# Patient Record
Sex: Female | Born: 2000
Health system: Southern US, Community
[De-identification: ages and names within clinical notes are randomized; demographics above are authoritative.]

## PROBLEM LIST (undated history)

## (undated) DIAGNOSIS — F419 Anxiety disorder, unspecified: Secondary | ICD-10-CM

## (undated) DIAGNOSIS — F32A Depression, unspecified: Secondary | ICD-10-CM

## (undated) DIAGNOSIS — J302 Other seasonal allergic rhinitis: Secondary | ICD-10-CM

---
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
## 2019-09-13 ENCOUNTER — Other Ambulatory Visit: Payer: Self-pay

## 2019-09-13 ENCOUNTER — Ambulatory Visit
Admission: EM | Admit: 2019-09-13 | Discharge: 2019-09-13 | Disposition: A | Discharge status: Home / Self Care | Payer: Medicaid Other | Attending: Family Medicine | Admitting: Family Medicine

## 2019-09-13 DIAGNOSIS — Z20822 Contact with and (suspected) exposure to covid-19: Secondary | ICD-10-CM | POA: Insufficient documentation

## 2019-09-13 LAB — SARS CORONAVIRUS 2 (TAT 6-24 HRS): SARS Coronavirus 2: NEGATIVE

## 2019-09-13 NOTE — Discharge Instructions (Signed)
COVID-19: What Your Test Results Mean If you test positive for COVID-19 Take steps to help prevent the spread of COVID-19 Stay home.  Do not leave your home, except to get medical care. Do not visit public areas. Get rest and stay hydrated. Take over-the-counter medicines, such as acetaminophen, to help you feel better. Stay in touch with your doctor. Separate yourself from other people.  As much as possible, stay in a specific room and away from other people and pets in your home. If you test negative for COVID-19  You probably were not infected at the time your sample was collected.  However, that does not mean you will not get sick.  It is possible that you were very early in your infection when your sample was collected and that you could test positive later. A negative test result does not mean you won't get sick later. [email or web address] 06/02/2018 This information is not intended to replace advice given to you by your health care provider. Make sure you discuss any questions you have with your health care provider. Document Revised: 12/06/2018 Document Reviewed: 12/06/2018 Elsevier Patient Education  [employer]. Please continue to keep social distance of 6 feet from others, wash hands frequently and wear facemask indoors or outdoors if you are unable to social distance.  If your Covid test is positive, member of the urgent care team will reach out to you with further instructions. If you have any further questions, please don't hesitate to reach out to the urgent care clinic.  Please sign up for MyChart to access your lab results.

## 2019-09-13 NOTE — ED Triage Notes (Signed)
Pt here for COVID testing without symptoms

## 2019-12-11 ENCOUNTER — Ambulatory Visit
Admission: EM | Admit: 2019-12-11 | Discharge: 2019-12-11 | Disposition: A | Discharge status: Home / Self Care | Payer: Medicaid Other | Attending: Family Medicine | Admitting: Family Medicine

## 2019-12-11 ENCOUNTER — Other Ambulatory Visit: Payer: Self-pay

## 2019-12-11 DIAGNOSIS — R519 Headache, unspecified: Secondary | ICD-10-CM

## 2019-12-11 LAB — PREGNANCY, URINE: Preg Test, Ur: NEGATIVE

## 2019-12-11 MED ORDER — BUTALBITAL-APAP-CAFFEINE 50-325-40 MG PO TABS: 1.0000 | ORAL_TABLET | Freq: Four times a day (QID) | ORAL | 0 refills | Status: AC | PRN

## 2019-12-11 NOTE — Discharge Instructions (Signed)
Medication as prescribed.  Take care  Dr. Tavien Chestnut  

## 2019-12-11 NOTE — ED Triage Notes (Signed)
Patient states that she has been having a headache since yesterday with headache, pressure in head, nausea and report sthat she fainted yesterday. Reports that this all started yesterday and she has been taking tylenol without relief. Reports that this does not feel like her normal headache.

## 2019-12-11 NOTE — ED Provider Notes (Signed)
[facility]    CSN: [id number] Arrival date & time: 12/11/19  1441      History   Chief Complaint Chief Complaint  Patient presents with  . Headache   HPI  19 year old female presents with headache.  Headache started yesterday. Described as pressure. Located in the frontal region as well as the maxillary region. She reports associated nausea. Patient reports that she has been dizzy. Had an episode of syncope yesterday. She has taken Tylenol without relief. Denies photophobia. Rates her pain as 10/10 in severity. No known exacerbating factors.  Past Medical History:  Diagnosis Date  . Seasonal allergies     Home Medications    Prior to Admission medications   Medication Sig Start Date End Date Taking? Authorizing Provider  butalbital-acetaminophen-caffeine (FIORICET) 50-325-40 MG tablet Take 1 tablet by mouth every 6 (six) hours as needed for headache or migraine. 12/11/19 12/10/20  Tommie Sams, DO  montelukast (SINGULAIR) 10 MG tablet Take by mouth. 03/29/17 03/29/18  [provider]  medroxyPROGESTERone (DEPO-PROVERA) 150 MG/ML injection Inject 150 mg into the muscle every 3 (three) months.  12/11/19  [provider]   Social History Social History   Tobacco Use  . Smoking status: Current Every Day Smoker    Packs/day: 0.25    Types: Cigarettes  . Smokeless tobacco: Never Used  Vaping Use  . Vaping Use: Never used  Substance Use Topics  . Alcohol use: Not Currently  . Drug use: Never     Allergies   Patient has no known allergies.   Review of Systems Review of Systems  Gastrointestinal: Positive for nausea.  Neurological: Positive for headaches.   Physical Exam Triage Vital Signs ED Triage Vitals  Enc Vitals Group     BP 12/11/19 1511 117/68     Pulse Rate 12/11/19 1511 88     Resp 12/11/19 1511 16     Temp 12/11/19 1511 98.3 F (36.8 C)     Temp Source 12/11/19 1511 Oral     SpO2 12/11/19 1511 100 %     Weight 12/11/19  1509 125 lb (56.7 kg)     Height 12/11/19 1509 5' (1.524 m)     Head Circumference --      Peak Flow --      Pain Score 12/11/19 1509 10     Pain Loc --      Pain Edu? --      Excl. in GC? --    Updated Vital Signs BP 117/68 (BP Location: Right Arm)   Pulse 88   Temp 98.3 F (36.8 C) (Oral)   Resp 16   Ht 5' (1.524 m)   Wt 56.7 kg   LMP 11/28/2019   SpO2 100%   BMI 24.41 kg/m   Visual Acuity Right Eye Distance:   Left Eye Distance:   Bilateral Distance:    Right Eye Near:   Left Eye Near:    Bilateral Near:     Physical Exam Vitals and nursing note reviewed.  Constitutional:      General: She is not in acute distress.    Appearance: Normal appearance. She is not ill-appearing.  HENT:     Head: Normocephalic and atraumatic.  Eyes:     General:        Right eye: No discharge.        Left eye: No discharge.     Conjunctiva/sclera: Conjunctivae normal.  Cardiovascular:     Rate and Rhythm:  Normal rate and regular rhythm.  Pulmonary:     Effort: Pulmonary effort is normal.     Breath sounds: Normal breath sounds. No wheezing, rhonchi or rales.  Neurological:     Mental Status: She is alert.  Psychiatric:        Mood and Affect: Mood normal.        Behavior: Behavior normal.    UC Treatments / Results  Labs (all labs ordered are listed, but only abnormal results are displayed) Labs Reviewed  PREGNANCY, URINE    EKG   Radiology No results found.  Procedures Procedures (including critical care time)  Medications Ordered in UC Medications - No data to display  Initial Impression / Assessment and Plan / UC Course  I have reviewed the triage vital signs and the nursing notes.  Pertinent labs & imaging results that were available during my care of the patient were reviewed by me and considered in my medical decision making (see chart for details).    19 year old female presents with acute headache. Has a history of reported migraine. Concern for  atypical migraine. Discussed Covid testing and patient declined today. Treating with Fioricet. Pregnancy test was negative today.  Final Clinical Impressions(s) / UC Diagnoses   Final diagnoses:  Acute nonintractable headache, unspecified headache type     Discharge Instructions     Medication as prescribed.  Take care  Dr. Adriana Simas    ED Prescriptions    Medication Sig Dispense Auth. Provider   butalbital-acetaminophen-caffeine (FIORICET) 50-325-40 MG tablet Take 1 tablet by mouth every 6 (six) hours as needed for headache or migraine. 20 tablet Tommie Sams, DO     PDMP not reviewed this encounter.   Tommie Sams, [place] 12/11/19 1642

## 2020-02-18 ENCOUNTER — Other Ambulatory Visit: Payer: Self-pay

## 2020-02-18 ENCOUNTER — Ambulatory Visit
Admission: EM | Admit: 2020-02-18 | Discharge: 2020-02-18 | Disposition: A | Discharge status: Home / Self Care | Payer: Medicaid Other

## 2020-02-18 ENCOUNTER — Encounter: Payer: Self-pay | Admitting: Emergency Medicine

## 2020-02-18 DIAGNOSIS — G4700 Insomnia, unspecified: Secondary | ICD-10-CM

## 2020-02-18 DIAGNOSIS — R21 Rash and other nonspecific skin eruption: Secondary | ICD-10-CM | POA: Diagnosis not present

## 2020-02-18 MED ORDER — TRAZODONE HCL 50 MG PO TABS
50.0000 mg | ORAL_TABLET | Freq: Every day | ORAL | 0 refills | Status: AC
Start: 2020-02-18 — End: 2020-03-04

## 2020-02-18 MED ORDER — PREDNISONE 10 MG PO TABS: ORAL_TABLET | ORAL | 0 refills | Status: DC

## 2020-02-18 NOTE — ED Provider Notes (Signed)
[facility]    CSN: [id number] Arrival date & time: 02/18/20  1221      History   Chief Complaint Chief Complaint  Patient presents with  . Rash    HPI Jennifer Bray is a 20 y.o. female presenting for diffuse red, itchy rash diffusely on her body last night.  Patient states that she took Benadryl last night and it went away and then she took Benadryl again this morning and it went away.  She complains of a minor rash on her hands and arms currently.  Denies any facial rash.  Patient denies any throat tightness or swelling, chest tightness or breathing difficulty.  Patient denies any known allergens.  She denies starting any new medications.  Denies any new perfumes, lotions or detergents.  Not eating any new foods.  Never had any similar symptoms in the past.  Patient does request a refill of trazodone which she takes nightly for insomnia.  Patient also takes Prozac during the day for anxiety and depression.  No other complaints or concerns.  HPI  Past Medical History:  Diagnosis Date  . Seasonal allergies     There are no problems to display for this patient.   History reviewed. No pertinent surgical history.  OB History   No obstetric history on file.      Home Medications    Prior to Admission medications   Medication Sig Start Date End Date Taking? Authorizing Provider  predniSONE (DELTASONE) 10 MG tablet Take 6 tablets by mouth on the 1st day and decrease by 1 tablet daily 02/18/20  Yes Shirlee Latch, PA-C  traZODone (DESYREL) 50 MG tablet Take 1 tablet (50 mg total) by mouth at bedtime for 15 days. 02/18/20 03/04/20 Yes Shirlee Latch, PA-C  butalbital-acetaminophen-caffeine (FIORICET) [phone] MG tablet Take 1 tablet by mouth every 6 (six) hours as needed for headache or migraine. 12/11/19 12/10/20  Tommie Sams, DO  FLUoxetine (PROZAC) 10 MG capsule Take by mouth. 01/30/20   [provider]  montelukast (SINGULAIR) 10 MG tablet Take by mouth.  03/29/17 03/29/18  [provider]  medroxyPROGESTERone (DEPO-PROVERA) 150 MG/ML injection Inject 150 mg into the muscle every 3 (three) months.  12/11/19  [provider]    Family History No family history on file.  Social History Social History   Tobacco Use  . Smoking status: Current Every Day Smoker    Packs/day: 0.25    Types: Cigarettes  . Smokeless tobacco: Never Used  Vaping Use  . Vaping Use: Never used  Substance Use Topics  . Alcohol use: Not Currently  . Drug use: Yes    Types: Marijuana     Allergies   Patient has no known allergies.   Review of Systems Review of Systems  Constitutional: Negative for fatigue and fever.  HENT: Negative for congestion, sore throat and trouble swallowing.   Respiratory: Negative for cough, shortness of breath and wheezing.   Cardiovascular: Negative for chest pain.  Gastrointestinal: Negative for nausea and vomiting.  Musculoskeletal: Negative for arthralgias and myalgias.  Skin: Positive for rash. Negative for color change.  Allergic/Immunologic: Negative for environmental allergies and food allergies.  Neurological: Negative for dizziness, weakness and headaches.  Psychiatric/Behavioral: Positive for sleep disturbance.     Physical Exam Triage Vital Signs ED Triage Vitals  Enc Vitals Group     BP 02/18/20 1232 114/77     Pulse Rate 02/18/20 1232 70     Resp 02/18/20 1232 18  Temp 02/18/20 1232 98.5 F (36.9 C)     Temp Source 02/18/20 1232 Oral     SpO2 02/18/20 1232 100 %     Weight 02/18/20 1230 125 lb (56.7 kg)     Height 02/18/20 1230 5' (1.524 m)     Head Circumference --      Peak Flow --      Pain Score 02/18/20 1230 0     Pain Loc --      Pain Edu? --      Excl. in GC? --    No data found.  Updated Vital Signs BP 114/77 (BP Location: Left Arm)   Pulse 70   Temp 98.5 F (36.9 C) (Oral)   Resp 18   Ht 5' (1.524 m)   Wt 125 lb (56.7 kg)   LMP 02/04/2020 (Approximate)   SpO2  100%   BMI 24.41 kg/m       Physical Exam Vitals and nursing note reviewed.  Constitutional:      General: She is not in acute distress.    Appearance: Normal appearance. She is not ill-appearing or toxic-appearing.  HENT:     Head: Normocephalic and atraumatic.     Nose: Nose normal.     Mouth/Throat:     Mouth: Mucous membranes are moist.     Pharynx: Oropharynx is clear.  Eyes:     General: No scleral icterus.       Right eye: No discharge.        Left eye: No discharge.     Conjunctiva/sclera: Conjunctivae normal.  Cardiovascular:     Rate and Rhythm: Normal rate and regular rhythm.     Heart sounds: Normal heart sounds.  Pulmonary:     Effort: Pulmonary effort is normal. No respiratory distress.     Breath sounds: Normal breath sounds.  Musculoskeletal:     Cervical back: Neck supple.  Skin:    General: Skin is dry.     Findings: Rash (erythematous maculopapular rash affecting right forearm, dorsal hands, and lower back) present.  Neurological:     General: No focal deficit present.     Mental Status: She is alert. Mental status is at baseline.     Motor: No weakness.     Gait: Gait normal.  Psychiatric:        Mood and Affect: Mood normal.        Behavior: Behavior normal.        Thought Content: Thought content normal.      UC Treatments / Results  Labs (all labs ordered are listed, but only abnormal results are displayed) Labs Reviewed - No data to display  EKG   Radiology No results found.  Procedures Procedures (including critical care time)  Medications Ordered in UC Medications - No data to display  Initial Impression / Assessment and Plan / UC Course  I have reviewed the triage vital signs and the nursing notes.  Pertinent labs & imaging results that were available during my care of the patient were reviewed by me and considered in my medical decision making (see chart for details).   1.  Rash: Uncertain of etiology of rash since  patient cannot identify any possible triggers.  Advised her to continue Benadryl and I will also send in prednisone.  Encouraged her to make a follow-up appointment with her PCP if the rash continues over the next week.  Advised that in some cases patient is need to be referred to an  allergist to determine the cause of the rash.  ED precautions for allergies and rashes discussed with patient.  2.  Insomnia: Patient requested a refill of trazodone.  I reviewed patient's chart from her PCP office visit on 01/30/2020.  She takes 50 mg at bedtime as prescribed by her PCP.  Patient states that she ran out of her medication and would like a refill until she can see her PCP in 2 weeks.  I refilled the medication for her.   Final Clinical Impressions(s) / UC Diagnoses   Final diagnoses:  Rash  Insomnia, unspecified type     Discharge Instructions     DifficultContinue Benadryl as needed for the rash.  Have also sent prednisone.  If you continue to have rash throughout the week despite taking these medications, follow-up with PCP and request referral to an allergist to see what could be causing the rash.  Go to ED for any trouble swallowing.  I refilled your trazodone for the next 2 weeks.  Follow-up with PCP for additional refills.    ED Prescriptions    Medication Sig Dispense Auth. Provider   predniSONE (DELTASONE) 10 MG tablet Take 6 tablets by mouth on the 1st day and decrease by 1 tablet daily 21 tablet Eusebio Friendly B, PA-C   traZODone (DESYREL) 50 MG tablet Take 1 tablet (50 mg total) by mouth at bedtime for 15 days. 15 tablet Gareth Morgan     PDMP not reviewed this encounter.   Shirlee Latch, PA-C 02/18/20 1332

## 2020-02-18 NOTE — Discharge Instructions (Signed)
DifficultContinue Benadryl as needed for the rash.  Have also sent prednisone.  If you continue to have rash throughout the week despite taking these medications, follow-up with PCP and request referral to an allergist to see what could be causing the rash.  Go to ED for any trouble swallowing.  I refilled your trazodone for the next 2 weeks.  Follow-up with PCP for additional refills.

## 2020-02-18 NOTE — ED Triage Notes (Signed)
Pt c/o rash all over her body. She states when she take benadryl it goes away. She does not have the rash now.

## 2020-07-18 ENCOUNTER — Other Ambulatory Visit: Payer: Self-pay

## 2020-07-18 ENCOUNTER — Ambulatory Visit
Admission: EM | Admit: 2020-07-18 | Discharge: 2020-07-18 | Disposition: A | Discharge status: Home / Self Care | Payer: Medicaid Other | Attending: Family Medicine | Admitting: Family Medicine

## 2020-07-18 ENCOUNTER — Encounter: Payer: Self-pay | Admitting: Emergency Medicine

## 2020-07-18 DIAGNOSIS — K047 Periapical abscess without sinus: Secondary | ICD-10-CM | POA: Diagnosis present

## 2020-07-18 DIAGNOSIS — R519 Headache, unspecified: Secondary | ICD-10-CM | POA: Diagnosis not present

## 2020-07-18 LAB — POCT PREGNANCY, URINE: Preg Test, Ur: NEGATIVE

## 2020-07-18 MED ORDER — AMOXICILLIN-POT CLAVULANATE 875-125 MG PO TABS: 1.0000 | ORAL_TABLET | Freq: Two times a day (BID) | ORAL | 0 refills | Status: AC

## 2020-07-18 MED ORDER — IBUPROFEN 600 MG PO TABS: 600.0000 mg | ORAL_TABLET | Freq: Four times a day (QID) | ORAL | 0 refills | Status: AC | PRN

## 2020-07-18 NOTE — ED Provider Notes (Signed)
[facility]    CSN: [id number] Arrival date & time: 07/18/20  1119      History   Chief Complaint Chief Complaint  Patient presents with   Dental Pain   Headache    HPI Jennifer Bray is a 20 y.o. female.   HPI  20 year old female here for evaluation of headache and tooth pain.  Patient reports that she developed headache in both of her temples and in her frontal area 2 days ago.  This is associated with some nausea and light sensitivity.  She denies any aura.  She states she only started to get these headaches 1 year ago which her primary care doctor are identifying as migraines.  She was instructed to take Tylenol which has not helped.  Her dental pain started the same time and it is her left lower rear molar.  She states that it hurts to apply pressure but it does not hurt with temperature differences.  There is no drainage from around the tooth, fever, or swelling of her jaw.  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  Past Medical History:  Diagnosis Date   Seasonal allergies     There are no problems to display for this patient.   History reviewed. No pertinent surgical history.  OB History   No obstetric history on file.      Home Medications    Prior to Admission medications   Medication Sig Start Date End Date Taking? Authorizing Provider  amoxicillin-clavulanate (AUGMENTIN) 875-125 MG tablet Take 1 tablet by mouth every 12 (twelve) hours for 10 days. 07/18/20 07/28/20 Yes Becky Augusta, NP  ibuprofen (ADVIL) 600 MG tablet Take 1 tablet (600 mg total) by mouth every 6 (six) hours as needed. 07/18/20  Yes Becky Augusta, NP  butalbital-acetaminophen-caffeine (FIORICET) [phone] MG tablet Take 1 tablet by mouth every 6 (six) hours as needed for headache or migraine. 12/11/19 12/10/20  Tommie Sams, DO  FLUoxetine (PROZAC) 10 MG capsule Take by mouth. 01/30/20   [provider]  montelukast (SINGULAIR) 10 MG tablet Take by mouth. 03/29/17 03/29/18  [provider]  traZODone (DESYREL) 50 MG tablet Take 1 tablet (50 mg total) by mouth at bedtime for 15 days. 02/18/20 03/04/20  Shirlee Latch, PA-C  medroxyPROGESTERone (DEPO-PROVERA) 150 MG/ML injection Inject 150 mg into the muscle every 3 (three) months.  12/11/19  [provider]    Family History History reviewed. No pertinent family history.  Social History Social History   Tobacco Use   Smoking status: Every Day    Packs/day: 0.25    Types: Cigarettes   Smokeless tobacco: Never  Vaping Use   Vaping Use: Never used  Substance Use Topics   Alcohol use: Not Currently   Drug use: Yes    Types: Marijuana     Allergies   Patient has no known allergies.   Review of Systems Review of Systems  Constitutional:  Negative for activity change, appetite change and fever.  HENT:  Positive for dental problem. Negative for congestion, ear pain, facial swelling and rhinorrhea.   Eyes:  Positive for photophobia.  Gastrointestinal:  Positive for nausea.  Neurological:  Positive for headaches. Negative for dizziness.    Physical Exam Triage Vital Signs ED Triage Vitals  Enc Vitals Group     BP 07/18/20 1129 113/82     Pulse Rate 07/18/20 1129 77     Resp 07/18/20 1129 14     Temp 07/18/20 1129 98.7 F (37.1 C)  Temp Source 07/18/20 1129 Oral     SpO2 07/18/20 1129 100 %     Weight 07/18/20 1127 105 lb (47.6 kg)     Height 07/18/20 1127 5' (1.524 m)     Head Circumference --      Peak Flow --      Pain Score 07/18/20 1127 7     Pain Loc --      Pain Edu? --      Excl. in GC? --    No data found.  Updated Vital Signs BP 113/82 (BP Location: Left Arm)   Pulse 77   Temp 98.7 F (37.1 C) (Oral)   Resp 14   Ht 5' (1.524 m)   Wt 105 lb (47.6 kg)   LMP  (LMP Unknown)   SpO2 100%   BMI 20.51 kg/m   Visual Acuity Right Eye Distance:   Left Eye Distance:   Bilateral Distance:    Right Eye Near:   Left Eye Near:    Bilateral Near:     Physical  Exam Vitals and nursing note reviewed.  Constitutional:      General: She is not in acute distress.    Appearance: Normal appearance. She is normal weight. She is not ill-appearing.  HENT:     Head: Normocephalic and atraumatic.     Mouth/Throat:     Mouth: Mucous membranes are moist.     Pharynx: Oropharynx is clear. No oropharyngeal exudate or posterior oropharyngeal erythema.  Eyes:     General: No scleral icterus.    Extraocular Movements: Extraocular movements intact.     Pupils: Pupils are equal, round, and reactive to light.  Skin:    General: Skin is warm and dry.     Capillary Refill: Capillary refill takes less than 2 seconds.     Findings: No erythema or rash.  Neurological:     General: No focal deficit present.     Mental Status: She is alert and oriented to person, place, and time.  Psychiatric:        Mood and Affect: Mood normal.        Behavior: Behavior normal.        Thought Content: Thought content normal.        Judgment: Judgment normal.     UC Treatments / Results  Labs (all labs ordered are listed, but only abnormal results are displayed) Labs Reviewed  POC URINE PREG, ED  POCT PREGNANCY, URINE    EKG   Radiology No results found.  Procedures Procedures (including critical care time)  Medications Ordered in UC Medications - No data to display  Initial Impression / Assessment and Plan / UC Course  I have reviewed the triage vital signs and the nursing notes.  Pertinent labs & imaging results that were available during my care of the patient were reviewed by me and considered in my medical decision making (see chart for details).  Patient is a nontoxic-appearing 20 year old female here for evaluation of tooth ache and headache as outlined in the HPI above.  Patient reports that her headache is in both temples and in her forehead which is a typical presentation for her headaches.  She was still getting headaches a year ago.  She is taken  Tylenol without any relief.  She has not tried ibuprofen.  Patient's physical exam reveals pupils are equal round and reactive and her EOM is intact.  Patient is light sensitive.  Cranial nerves II through XII are intact.  Oropharyngeal exam reveals pink and moist gingival tissue without any drainage from around any of her teeth.  Patient reports pain in the last molar on the left mandible in the rear.  This tooth is tender to percussion.  There is no drainage from around the tooth.  There is no bogginess to the floor of the mouth, the submental area, or on the buccal side of the gum.  The gum is tender to palpation.  Suspect patient has an apical abscess and will treat with Augmentin twice daily for 10 days.  I have also advised patient to try ibuprofen for her headache and will prescribe 600 mg tablets that she can take every 6 hours.  Patient vies to return if her pain does not improve and we could try sumatriptan.   Final Clinical Impressions(s) / UC Diagnoses   Final diagnoses:  Dental abscess  Acute nonintractable headache, unspecified headache type     Discharge Instructions      Take the ibuprofen 600 mg every 6 hours with food for relief of your headache pain and dental pain.  Use it on as-needed basis only.  Take the Augmentin twice daily with food for 10 days for treatment of your dental infection.  Make an appointment to follow-up with your dentist as you may need x-rays or further dental procedures done to resolve the infection causing your dental pain.  If you do not have any relief of your symptoms, or they get worse I would recommend going to [facility] as they have a dentist on-call.  If your headache pain does not improve with the ibuprofen you can return for reevaluation and we can try Imitrex.     ED Prescriptions     Medication Sig Dispense Auth. Provider   ibuprofen (ADVIL) 600 MG tablet Take 1 tablet (600 mg total) by mouth every 6 (six) hours as needed. 30  tablet Becky Augusta, NP   amoxicillin-clavulanate (AUGMENTIN) 875-125 MG tablet Take 1 tablet by mouth every 12 (twelve) hours for 10 days. 20 tablet Becky Augusta, NP      PDMP not reviewed this encounter.   Becky Augusta, NP 07/18/20 1207

## 2020-07-18 NOTE — Discharge Instructions (Addendum)
Take the ibuprofen 600 mg every 6 hours with food for relief of your headache pain and dental pain.  Use it on as-needed basis only.  Take the Augmentin twice daily with food for 10 days for treatment of your dental infection.  Make an appointment to follow-up with your dentist as you may need x-rays or further dental procedures done to resolve the infection causing your dental pain.  If you do not have any relief of your symptoms, or they get worse I would recommend going to [facility] as they have a dentist on-call.  If your headache pain does not improve with the ibuprofen you can return for reevaluation and we can try Imitrex.

## 2020-07-18 NOTE — ED Triage Notes (Signed)
Patient tooth pain on the left lower side that started on Thursday.  Patient c/o HA that started yesterday.  Patient denies fevers.

## 2020-08-06 ENCOUNTER — Encounter: Payer: Self-pay | Admitting: Emergency Medicine

## 2020-08-06 ENCOUNTER — Other Ambulatory Visit: Payer: Self-pay

## 2020-08-06 ENCOUNTER — Ambulatory Visit
Admission: EM | Admit: 2020-08-06 | Discharge: 2020-08-06 | Disposition: A | Discharge status: Home / Self Care | Payer: Medicaid Other | Attending: Emergency Medicine | Admitting: Emergency Medicine

## 2020-08-06 DIAGNOSIS — B349 Viral infection, unspecified: Secondary | ICD-10-CM | POA: Insufficient documentation

## 2020-08-06 DIAGNOSIS — Z20822 Contact with and (suspected) exposure to covid-19: Secondary | ICD-10-CM | POA: Insufficient documentation

## 2020-08-06 LAB — RESP PANEL BY RT-PCR (FLU A&B, COVID) ARPGX2
Influenza A by PCR: NEGATIVE
Influenza B by PCR: NEGATIVE
SARS Coronavirus 2 by RT PCR: NEGATIVE

## 2020-08-06 NOTE — ED Provider Notes (Signed)
[facility]    CSN: [id number] Arrival date & time: 08/06/20  1047      History   Chief Complaint Chief Complaint  Patient presents with   Sore Throat   Nausea   Dizziness    HPI Jennifer Bray is a 20 y.o. female.   Patient is a 20 year old female who presents with chief complaint of sore throat, burning sensation in her head, lightheadedness, and nausea.  Patient states her symptoms started at work yesterday.  She works as a Mudlogger at [employer] and states she has had some sick coworkers but they are not told where they have COVID or not.  She has had no contacts that have been sick at home.  Patient reports that she had COVID back in the pandemic for started and has not had any COVID vaccinations.  She is taken Tylenol at home but has not helped.  Patient denies fever or chills but does report some shortness of breath.  Patient denies any body aches or pains or any loss of taste or smell.  Patient also denies any ear pain or fullness.   Past Medical History:  Diagnosis Date   Seasonal allergies     There are no problems to display for this patient.   History reviewed. No pertinent surgical history.  OB History   No obstetric history on file.      Home Medications    Prior to Admission medications   Medication Sig Start Date End Date Taking? Authorizing Provider  butalbital-acetaminophen-caffeine (FIORICET) 50-325-40 MG tablet Take 1 tablet by mouth every 6 (six) hours as needed for headache or migraine. 12/11/19 12/10/20  Tommie Sams, DO  FLUoxetine (PROZAC) 10 MG capsule Take by mouth. 01/30/20   [provider]  ibuprofen (ADVIL) 600 MG tablet Take 1 tablet (600 mg total) by mouth every 6 (six) hours as needed. 07/18/20   Becky Augusta, NP  montelukast (SINGULAIR) 10 MG tablet Take by mouth. 03/29/17 03/29/18  [provider]  traZODone (DESYREL) 50 MG tablet Take 1 tablet (50 mg total) by mouth at bedtime for 15 days. 02/18/20 03/04/20   Shirlee Latch, PA-C  medroxyPROGESTERone (DEPO-PROVERA) 150 MG/ML injection Inject 150 mg into the muscle every 3 (three) months.  12/11/19  [provider]    Family History History reviewed. No pertinent family history.  Social History Social History   Tobacco Use   Smoking status: Every Day    Packs/day: 0.25    Types: Cigarettes   Smokeless tobacco: Never  Vaping Use   Vaping Use: Never used  Substance Use Topics   Alcohol use: Not Currently   Drug use: Yes    Types: Marijuana     Allergies   Patient has no known allergies.   Review of Systems Review of Systems as noted above in HPI.  Other systems reviewed and found to be negative   Physical Exam Triage Vital Signs ED Triage Vitals  Enc Vitals Group     BP 08/06/20 1131 113/83     Pulse Rate 08/06/20 1131 79     Resp 08/06/20 1131 18     Temp 08/06/20 1131 98.1 F (36.7 C)     Temp Source 08/06/20 1131 Oral     SpO2 08/06/20 1131 100 %     Weight --      Height --      Head Circumference --      Peak Flow --      Pain Score  08/06/20 1127 8     Pain Loc --      Pain Edu? --      Excl. in GC? --    No data found.  Updated Vital Signs BP 113/83   Pulse 79   Temp 98.1 F (36.7 C) (Oral)   Resp 18   LMP  (LMP Unknown)   SpO2 100%     Physical Exam Constitutional:      General: She is not in acute distress.    Appearance: She is well-developed.  HENT:     Right Ear: Tympanic membrane normal.     Left Ear: Tympanic membrane normal.     Mouth/Throat:     Mouth: Mucous membranes are moist.     Pharynx: Posterior oropharyngeal erythema (minimal) present.     Tonsils: No tonsillar exudate or tonsillar abscesses. 0 on the right. 0 on the left.  Eyes:     Conjunctiva/sclera: Conjunctivae normal.     Pupils: Pupils are equal, round, and reactive to light.  Cardiovascular:     Rate and Rhythm: Normal rate and regular rhythm.     Heart sounds: Normal heart sounds.  Pulmonary:      Effort: Pulmonary effort is normal. No respiratory distress.     Breath sounds: Normal breath sounds. No stridor.  Abdominal:     Palpations: Abdomen is soft.  Musculoskeletal:     Cervical back: Normal range of motion.  Lymphadenopathy:     Cervical: No cervical adenopathy.  Skin:    General: Skin is warm and dry.     Capillary Refill: Capillary refill takes less than 2 seconds.  Neurological:     General: No focal deficit present.     Mental Status: She is alert and oriented to person, place, and time.     UC Treatments / Results  Labs (all labs ordered are listed, but only abnormal results are displayed) Labs Reviewed  RESP PANEL BY RT-PCR (FLU A&B, COVID) [id number]    EKG   Radiology No results found.  Procedures Procedures (including critical care time)  Medications Ordered in UC Medications - No data to display  Initial Impression / Assessment and Plan / UC Course  I have reviewed the triage vital signs and the nursing notes.  Pertinent labs & imaging results that were available during my care of the patient were reviewed by me and considered in my medical decision making (see chart for details).    Patient presents with upper restorer symptoms of sore throat, lightheadedness, nausea, and a burning sensation in her head as well as some shortness of breath.  Patient without fever, chills, or body aches.  She does report sick contacts at work but they are not notified if there are coworkers are positive for COVID.  Send a COVID swab.  COVID swab was negative.  Likely viral illness.  Treat with symptom management.  Have her return should symptoms worsen or not improve Final Clinical Impressions(s) / UC Diagnoses   Final diagnoses:  Viral illness     Discharge Instructions      -Flu and COVID swabs are negative -Symptoms most likely a viral illness. -Recommend over-the-counter medications for symptom management.  Ibuprofen and Tylenol as needed for pain and  fever.  continue your Singulair. -Can use nasal saline rinse and Flonase to help with nasal congestion. -Push fluids -Rest as able until symptoms improve     ED Prescriptions   None    PDMP not reviewed this encounter.  Candis Schatz, PA-C 08/06/20 1232

## 2020-08-06 NOTE — Discharge Instructions (Addendum)
-  Flu and COVID swabs are negative -Symptoms most likely a viral illness. -Recommend over-the-counter medications for symptom management.  Ibuprofen and Tylenol as needed for pain and fever.  continue your Singulair. -Can use nasal saline rinse and Flonase to help with nasal congestion. -Push fluids -Rest as able until symptoms improve

## 2020-08-06 NOTE — ED Triage Notes (Signed)
Pt is present today with congestion, HA, lightheadedness, sore throat, and nausea. Pt states that her sx started yesterday

## 2020-10-01 ENCOUNTER — Ambulatory Visit
Admission: EM | Admit: 2020-10-01 | Discharge: 2020-10-01 | Disposition: A | Discharge status: Home / Self Care | Payer: Medicaid Other | Attending: Emergency Medicine | Admitting: Emergency Medicine

## 2020-10-01 ENCOUNTER — Other Ambulatory Visit: Payer: Self-pay

## 2020-10-01 DIAGNOSIS — K2990 Gastroduodenitis, unspecified, without bleeding: Secondary | ICD-10-CM

## 2020-10-01 DIAGNOSIS — K2970 Gastritis, unspecified, without bleeding: Secondary | ICD-10-CM | POA: Diagnosis not present

## 2020-10-01 MED ORDER — OMEPRAZOLE 20 MG PO CPDR: 20.0000 mg | DELAYED_RELEASE_CAPSULE | Freq: Every day | ORAL | 0 refills | Status: DC

## 2020-10-01 MED ORDER — ALUM & MAG HYDROXIDE-SIMETH 200-200-20 MG/5ML PO SUSP
30.0000 mL | Freq: Once | ORAL | Status: AC
  Administered 2020-10-01: 30 mL via ORAL

## 2020-10-01 MED ORDER — LIDOCAINE VISCOUS HCL 2 % MT SOLN
15.0000 mL | Freq: Once | OROMUCOSAL | Status: AC
  Administered 2020-10-01: 15 mL via ORAL

## 2020-10-01 MED ORDER — ONDANSETRON 8 MG PO TBDP
8.0000 mg | ORAL_TABLET | Freq: Once | ORAL | Status: AC
  Administered 2020-10-01: 8 mg via ORAL

## 2020-10-01 NOTE — ED Provider Notes (Addendum)
[facility]    CSN: [id number] Arrival date & time: 10/01/20  1336      History   Chief Complaint Chief Complaint  Patient presents with   Emesis    HPI Jennifer Bray is a 20 y.o. female.   HPI  Three 20 year old female here for evaluation of GI complaints.  Patient reports that she has had 2 episodes of vomiting in the last 3 days.  This is associated with some epigastric burning, nausea, and 1 to episodes of diarrhea.  She denies any additional abdominal pain, fever, blood in her emesis or stool, upper respiratory symptoms, or cough.  She states that she has been around some people who recently tested positive for the flu.  Patient reports that she eats spicy food every day, drinks 3-4 sodas every day, and also smokes daily.  Her last consumption of spicy foods was the day her symptoms started.  Past Medical History:  Diagnosis Date   Seasonal allergies     There are no problems to display for this patient.   History reviewed. No pertinent surgical history.  OB History   No obstetric history on file.      Home Medications    Prior to Admission medications   Medication Sig Start Date End Date Taking? Authorizing Provider  omeprazole (PRILOSEC) 20 MG capsule Take 1 capsule (20 mg total) by mouth daily. 10/01/20  Yes Becky Augusta, NP  butalbital-acetaminophen-caffeine (FIORICET) [phone] MG tablet Take 1 tablet by mouth every 6 (six) hours as needed for headache or migraine. 12/11/19 12/10/20  Tommie Sams, DO  FLUoxetine (PROZAC) 10 MG capsule Take by mouth. 01/30/20   [provider]  ibuprofen (ADVIL) 600 MG tablet Take 1 tablet (600 mg total) by mouth every 6 (six) hours as needed. 07/18/20   Becky Augusta, NP  montelukast (SINGULAIR) 10 MG tablet Take by mouth. 03/29/17 03/29/18  [provider]  traZODone (DESYREL) 50 MG tablet Take 1 tablet (50 mg total) by mouth at bedtime for 15 days. 02/18/20 03/04/20  Shirlee Latch, PA-C   medroxyPROGESTERone (DEPO-PROVERA) 150 MG/ML injection Inject 150 mg into the muscle every 3 (three) months.  12/11/19  [provider]    Family History History reviewed. No pertinent family history.  Social History Social History   Tobacco Use   Smoking status: Every Day    Packs/day: 0.25    Types: Cigarettes   Smokeless tobacco: Never  Vaping Use   Vaping Use: Never used  Substance Use Topics   Alcohol use: Not Currently   Drug use: Yes    Types: Marijuana     Allergies   Patient has no known allergies.   Review of Systems Review of Systems  Constitutional:  Negative for activity change, appetite change and fever.  HENT:  Negative for congestion, ear pain, rhinorrhea and sore throat.   Respiratory:  Negative for cough.   Gastrointestinal:  Positive for abdominal pain, diarrhea, nausea and vomiting.  Hematological: Negative.   Psychiatric/Behavioral: Negative.      Physical Exam Triage Vital Signs ED Triage Vitals  Enc Vitals Group     BP 10/01/20 1402 113/68     Pulse Rate 10/01/20 1402 70     Resp 10/01/20 1402 16     Temp 10/01/20 1402 98.5 F (36.9 C)     Temp Source 10/01/20 1402 Oral     SpO2 10/01/20 1402 100 %     Weight 10/01/20 1403 109 lb (49.4 kg)  Height 10/01/20 1403 5' (1.524 m)     Head Circumference --      Peak Flow --      Pain Score 10/01/20 1403 0     Pain Loc --      Pain Edu? --      Excl. in GC? --    No data found.  Updated Vital Signs BP 113/68   Pulse 70   Temp 98.5 F (36.9 C) (Oral)   Resp 16   Ht 5' (1.524 m)   Wt 109 lb (49.4 kg)   LMP 09/14/2020 (Exact Date)   SpO2 100%   BMI 21.29 kg/m   Visual Acuity Right Eye Distance:   Left Eye Distance:   Bilateral Distance:    Right Eye Near:   Left Eye Near:    Bilateral Near:     Physical Exam Vitals and nursing note reviewed.  Constitutional:      General: She is not in acute distress.    Appearance: Normal appearance. She is normal weight.  She is not ill-appearing.  HENT:     Head: Normocephalic and atraumatic.  Cardiovascular:     Rate and Rhythm: Normal rate and regular rhythm.     Pulses: Normal pulses.     Heart sounds: Normal heart sounds. No murmur heard.   No gallop.  Pulmonary:     Effort: Pulmonary effort is normal.     Breath sounds: Normal breath sounds. No wheezing, rhonchi or rales.  Abdominal:     General: Abdomen is flat. Bowel sounds are normal. There is no distension.     Palpations: Abdomen is soft.     Tenderness: There is abdominal tenderness. There is no guarding or rebound.  Skin:    General: Skin is warm and dry.     Capillary Refill: Capillary refill takes less than 2 seconds.     Findings: No erythema or rash.  Neurological:     General: No focal deficit present.     Mental Status: She is alert and oriented to person, place, and time.  Psychiatric:        Mood and Affect: Mood normal.        Behavior: Behavior normal.        Thought Content: Thought content normal.        Judgment: Judgment normal.     UC Treatments / Results  Labs (all labs ordered are listed, but only abnormal results are displayed) Labs Reviewed - No data to display  EKG   Radiology No results found.  Procedures Procedures (including critical care time)  Medications Ordered in UC Medications  ondansetron (ZOFRAN-ODT) disintegrating tablet 8 mg (8 mg Oral Given 10/01/20 1524)  alum & mag hydroxide-simeth (MAALOX/MYLANTA) 200-200-20 MG/5ML suspension 30 mL (30 mLs Oral Given 10/01/20 1524)    And  lidocaine (XYLOCAINE) 2 % viscous mouth solution 15 mL (15 mLs Oral Given 10/01/20 1524)    Initial Impression / Assessment and Plan / UC Course  I have reviewed the triage vital signs and the nursing notes.  Pertinent labs & imaging results that were available during my care of the patient were reviewed by me and considered in my medical decision making (see chart for details).  Patient is a very pleasant,  nontoxic-appearing 20 year old female here for evaluation of epigastric burning with vomiting and diarrhea that started 3 days ago.  She has had 1-2 episodes of diarrhea and 2 episodes of vomiting.  Not a lot of have blood  in it.  She is had some mild nausea.  She states that she has been eating and drinking but she has been eating bland fluids which still make her nauseous.  She states that she has a pretty significant diet of spicy foods, drinks 3+ caffeinated beverages a day, and also smokes daily.  Patient's physical exam reveals a benign cardiopulmonary exam with clear lung sounds in all fields.  Abdomen soft, flat, with positive bowel sounds all 4 quadrants.  Patient has moderate epigastric tenderness without guarding or rebound.  No additional tenderness discovered in the abdomen.  Suspect patient has gastritis secondary to diet and smoking.  Will give Zofran and GI cocktail and reassess.  Patient reports that she is feeling significantly better following the Zofran and GI cocktail.  Will discharge patient with a diagnosis of gastritis with a PPI and have her follow a bland diet until her symptoms resolve.     Final Clinical Impressions(s) / UC Diagnoses   Final diagnoses:  Gastritis and gastroduodenitis     Discharge Instructions      Avoid caffeine, spicy foods, and cut back on smoking as much as possible as this will make your gastritis worse.  Take the omeprazole once daily for the next 30 days to calm down stomach inflammation by cutting back on the amount of stomach acid being produced.  Follow a bland diet until your symptoms have completely resolved.  You can also take over-the-counter aloe vera, 3 to 4 ounces of juice, 1 or 2 gelcaps twice daily, to calm down gastric inflammation.  Return for reevaluation if develop any increased abdominal pain, increasing vomiting or diarrhea, blood in your vomit or diarrhea, or fever.     ED Prescriptions     Medication Sig Dispense  Auth. Provider   omeprazole (PRILOSEC) 20 MG capsule Take 1 capsule (20 mg total) by mouth daily. 30 capsule Becky Augusta, NP      PDMP not reviewed this encounter.   Becky Augusta, NP 10/01/20 1527    Becky Augusta, NP 10/01/20 [phone]

## 2020-10-01 NOTE — ED Triage Notes (Signed)
Pt reports having 2 episodes of emesis over the last 3 days. Denies having any other symptoms.

## 2020-10-01 NOTE — Discharge Instructions (Addendum)
Avoid caffeine, spicy foods, and cut back on smoking as much as possible as this will make your gastritis worse.  Take the omeprazole once daily for the next 30 days to calm down stomach inflammation by cutting back on the amount of stomach acid being produced.  Follow a bland diet until your symptoms have completely resolved.  You can also take over-the-counter aloe vera, 3 to 4 ounces of juice, 1 or 2 gelcaps twice daily, to calm down gastric inflammation.  Return for reevaluation if develop any increased abdominal pain, increasing vomiting or diarrhea, blood in your vomit or diarrhea, or fever.

## 2020-11-04 ENCOUNTER — Ambulatory Visit: Payer: Self-pay

## 2020-11-05 ENCOUNTER — Other Ambulatory Visit: Payer: Self-pay

## 2020-11-05 ENCOUNTER — Encounter: Payer: Self-pay | Admitting: Emergency Medicine

## 2020-11-05 ENCOUNTER — Inpatient Hospital Stay
Admission: RE | Admit: 2020-11-05 | Discharge: 2020-11-05 | Disposition: A | Discharge status: Home / Self Care | Payer: Self-pay | Source: Ambulatory Visit

## 2020-11-05 ENCOUNTER — Ambulatory Visit
Admission: EM | Admit: 2020-11-05 | Discharge: 2020-11-05 | Disposition: A | Discharge status: Home / Self Care | Payer: Medicaid Other | Attending: Physician Assistant | Admitting: Physician Assistant

## 2020-11-05 DIAGNOSIS — B9689 Other specified bacterial agents as the cause of diseases classified elsewhere: Secondary | ICD-10-CM | POA: Diagnosis present

## 2020-11-05 DIAGNOSIS — N760 Acute vaginitis: Secondary | ICD-10-CM | POA: Diagnosis present

## 2020-11-05 LAB — CHLAMYDIA/NGC RT PCR (ARMC ONLY)
Chlamydia Tr: NOT DETECTED
N gonorrhoeae: NOT DETECTED

## 2020-11-05 LAB — WET PREP, GENITAL
Sperm: NONE SEEN
Trich, Wet Prep: NONE SEEN
WBC, Wet Prep HPF POC: NONE SEEN
Yeast Wet Prep HPF POC: NONE SEEN

## 2020-11-05 MED ORDER — METRONIDAZOLE 500 MG PO TABS: 500.0000 mg | ORAL_TABLET | Freq: Two times a day (BID) | ORAL | 0 refills | Status: DC

## 2020-11-05 NOTE — ED Provider Notes (Addendum)
[facility]    CSN: [id number] Arrival date & time: 11/05/20  1609      History   Chief Complaint Chief Complaint  Patient presents with   Vaginal Discharge   vaginal discomfort    HPI Llana Deshazo is a 20 y.o. female.   Patient presents with Pete Schnitzer thin discharge, vaginal irritation and lower abdominal cramping for 3 days.  Denies itching, odor, dysuria, urinary frequency, urinary urgency, hematuria, flank pain.  Sexually active, 1 partner, no condom use.  No pertinent medical history.   Past Medical History:  Diagnosis Date   Seasonal allergies     There are no problems to display for this patient.   History reviewed. No pertinent surgical history.  OB History   No obstetric history on file.      Home Medications    Prior to Admission medications   Medication Sig Start Date End Date Taking? Authorizing Provider  butalbital-acetaminophen-caffeine (FIORICET) 50-325-40 MG tablet Take 1 tablet by mouth every 6 (six) hours as needed for headache or migraine. 12/11/19 12/10/20  Tommie Sams, DO  FLUoxetine (PROZAC) 10 MG capsule Take by mouth. 01/30/20   [provider]  ibuprofen (ADVIL) 600 MG tablet Take 1 tablet (600 mg total) by mouth every 6 (six) hours as needed. 07/18/20   Becky Augusta, NP  montelukast (SINGULAIR) 10 MG tablet Take by mouth. 03/29/17 03/29/18  [provider]  omeprazole (PRILOSEC) 20 MG capsule Take 1 capsule (20 mg total) by mouth daily. 10/01/20   Becky Augusta, NP  traZODone (DESYREL) 50 MG tablet Take 1 tablet (50 mg total) by mouth at bedtime for 15 days. 02/18/20 03/04/20  Shirlee Latch, PA-C  medroxyPROGESTERone (DEPO-PROVERA) 150 MG/ML injection Inject 150 mg into the muscle every 3 (three) months.  12/11/19  [provider]    Family History History reviewed. No pertinent family history.  Social History Social History   Tobacco Use   Smoking status: Every Day    Packs/day: 0.25    Types:  Cigarettes   Smokeless tobacco: Never  Vaping Use   Vaping Use: Never used  Substance Use Topics   Alcohol use: Not Currently   Drug use: Yes    Types: Marijuana     Allergies   Patient has no known allergies.   Review of Systems Review of Systems  Constitutional: Negative.   Respiratory: Negative.    Gastrointestinal:  Positive for abdominal pain. Negative for abdominal distention, anal bleeding, blood in stool, constipation, diarrhea, nausea, rectal pain and vomiting.  Genitourinary:  Positive for vaginal discharge and vaginal pain. Negative for decreased urine volume, difficulty urinating, dyspareunia, dysuria, enuresis, flank pain, frequency, genital sores, hematuria, menstrual problem, pelvic pain, urgency and vaginal bleeding.  Skin: Negative.     Physical Exam Triage Vital Signs ED Triage Vitals  Enc Vitals Group     BP 11/05/20 1713 111/86     Pulse Rate 11/05/20 1713 80     Resp 11/05/20 1713 16     Temp 11/05/20 1713 98.6 F (37 C)     Temp Source 11/05/20 1713 Oral     SpO2 11/05/20 1713 100 %     Weight --      Height --      Head Circumference --      Peak Flow --      Pain Score 11/05/20 1711 0     Pain Loc --      Pain Edu? --  Excl. in GC? --    No data found.  Updated Vital Signs BP 111/86 (BP Location: Right Arm)   Pulse 80   Temp 98.6 F (37 C) (Oral)   Resp 16   LMP 10/16/2020   SpO2 100%   Visual Acuity Right Eye Distance:   Left Eye Distance:   Bilateral Distance:    Right Eye Near:   Left Eye Near:    Bilateral Near:     Physical Exam Constitutional:      Appearance: Normal appearance. She is normal weight.  HENT:     Head: Normocephalic.  Eyes:     Extraocular Movements: Extraocular movements intact.  Pulmonary:     Effort: Pulmonary effort is normal.  Abdominal:     General: Abdomen is flat. Bowel sounds are normal.     Palpations: Abdomen is soft.     Tenderness: There is abdominal tenderness in the suprapubic  area. There is no right CVA tenderness or left CVA tenderness.  Genitourinary:    Comments: Deferred self collect vaginal swab Skin:    General: Skin is warm and dry.  Neurological:     Mental Status: She is alert and oriented to person, place, and time. Mental status is at baseline.  Psychiatric:        Mood and Affect: Mood normal.        Behavior: Behavior normal.     UC Treatments / Results  Labs (all labs ordered are listed, but only abnormal results are displayed) Labs Reviewed  WET PREP, GENITAL  CHLAMYDIA/NGC RT PCR [facility] ONLY)              EKG   Radiology No results found.  Procedures Procedures (including critical care time)  Medications Ordered in UC Medications - No data to display  Initial Impression / Assessment and Plan / UC Course  I have reviewed the triage vital signs and the nursing notes.  Pertinent labs & imaging results that were available during my care of the patient were reviewed by me and considered in my medical decision making (see chart for details).  Bacterial vaginosis  1.  Wet prep positive for clue cells, negative for yeast and trichomoniasis, discussed results with patient 2.  Metronidazole 500 mg twice daily for 7 days 3.  Gonorrhea and chlamydia pending, will treat per protocol 4.  Advised abstinence until final labs result, treatment is complete and all symptoms have resolved 5.  Follow-up with urgent care as needed Final Clinical Impressions(s) / UC Diagnoses   Final diagnoses:  None   Discharge Instructions   None    ED Prescriptions   None    PDMP not reviewed this encounter.   Hans Eden, NP 11/05/20 1741    Hans Eden, NP 11/11/20 1949

## 2020-11-05 NOTE — Discharge Instructions (Signed)
Today your wet prep showed clue cells meaning presence of bacteria vaginosis, it was negative for yeast and trichomoniasis  Take metronidazole twice a day for the next 7 days  Labs pending for gonorrhea and chlamydia 2-3 days, you will be contacted if positive for any sti and treatment will be sent to the pharmacy, you will have to return to the clinic if positive for gonorrhea to receive treatment   Please refrain from having sex until labs results, if positive please refrain from having sex until treatment complete and symptoms resolve   If positive for  Chlamydia  gonorrhea or please notify partner or partners so they may tested as well  Moving forward, it is recommended you use some form of protection against the transmission of sti infections  such as condoms or dental dams with each sexual encounter

## 2020-11-05 NOTE — ED Triage Notes (Signed)
Pt presents today with c/o vaginal discomfort with vaginal discharge (white) x 3 days. Denies odor or dysuria.

## 2021-02-14 ENCOUNTER — Emergency Department
Admission: EM | Admit: 2021-02-14 | Discharge: 2021-02-14 | Disposition: A | Discharge status: Home / Self Care | Payer: Medicaid Other | Attending: Emergency Medicine | Admitting: Emergency Medicine

## 2021-02-14 ENCOUNTER — Encounter: Payer: Self-pay | Admitting: Emergency Medicine

## 2021-02-14 ENCOUNTER — Other Ambulatory Visit: Payer: Self-pay

## 2021-02-14 ENCOUNTER — Emergency Department: Payer: Medicaid Other

## 2021-02-14 DIAGNOSIS — N9489 Other specified conditions associated with female genital organs and menstrual cycle: Secondary | ICD-10-CM | POA: Diagnosis not present

## 2021-02-14 DIAGNOSIS — E871 Hypo-osmolality and hyponatremia: Secondary | ICD-10-CM | POA: Insufficient documentation

## 2021-02-14 DIAGNOSIS — O4690 Antepartum hemorrhage, unspecified, unspecified trimester: Secondary | ICD-10-CM

## 2021-02-14 DIAGNOSIS — O26851 Spotting complicating pregnancy, first trimester: Secondary | ICD-10-CM | POA: Diagnosis present

## 2021-02-14 DIAGNOSIS — O209 Hemorrhage in early pregnancy, unspecified: Secondary | ICD-10-CM | POA: Insufficient documentation

## 2021-02-14 DIAGNOSIS — Z3A01 Less than 8 weeks gestation of pregnancy: Secondary | ICD-10-CM | POA: Diagnosis not present

## 2021-02-14 DIAGNOSIS — N939 Abnormal uterine and vaginal bleeding, unspecified: Secondary | ICD-10-CM

## 2021-02-14 LAB — BASIC METABOLIC PANEL
Anion gap: 5 (ref 5–15)
BUN: 10 mg/dL (ref 6–20)
CO2: 23 mmol/L (ref 22–32)
Calcium: 8.8 mg/dL — ABNORMAL LOW (ref 8.9–10.3)
Chloride: 106 mmol/L (ref 98–111)
Creatinine, Ser: 0.54 mg/dL (ref 0.44–1.00)
GFR, Estimated: >60 mL/min (ref >60)
Glucose, Bld: 60 mg/dL — ABNORMAL LOW (ref 70–99)
Potassium: 3.7 mmol/L (ref 3.5–5.1)
Sodium: 134 mmol/L — ABNORMAL LOW (ref 135–145)

## 2021-02-14 LAB — URINALYSIS, COMPLETE (UACMP) WITH MICROSCOPIC
Bilirubin Urine: NEGATIVE
Glucose, UA: NEGATIVE mg/dL
Ketones, ur: NEGATIVE mg/dL
Leukocytes,Ua: NEGATIVE
Nitrite: NEGATIVE
Protein, ur: 30 mg/dL — AB
Specific Gravity, Urine: 1.026 (ref 1.005–1.030)
pH: 5 (ref 5.0–8.0)

## 2021-02-14 LAB — CBC
HCT: 36.6 % (ref 36.0–46.0)
Hemoglobin: 12.6 g/dL (ref 12.0–15.0)
MCH: 32.6 pg (ref 26.0–34.0)
MCHC: 34.4 g/dL (ref 30.0–36.0)
MCV: 94.8 fL (ref 80.0–100.0)
Platelets: 226 10*3/uL (ref 150–400)
RBC: 3.86 MIL/uL — ABNORMAL LOW (ref 3.87–5.11)
RDW: 11.9 % (ref 11.5–15.5)
WBC: 4.8 10*3/uL (ref 4.0–10.5)
nRBC: 0 % (ref 0.0–0.2)

## 2021-02-14 LAB — HCG, QUANTITATIVE, PREGNANCY: hCG, Beta Chain, Quant, S: 40332 m[IU]/mL — ABNORMAL HIGH (ref <5)

## 2021-02-14 LAB — ABO/RH: ABO/RH(D): O POS

## 2021-02-14 LAB — POC URINE PREG, ED: Preg Test, Ur: POSITIVE — AB

## 2021-02-14 NOTE — ED Notes (Signed)
Lab called to add on ABO/Rh to purple top sent down earlier. Confirmed that they would run test.

## 2021-02-14 NOTE — ED Provider Notes (Signed)
[facility] Provider Note    Event Date/Time   First MD Initiated Contact with Patient 02/14/21 1137     (approximate)   History   Vaginal bleeding   HPI  Jennifer Bray is a 21 y.o. female who presents with complaints of vaginal spotting which she noticed this morning.  She reports he was at urgent care recently and they told her that her pregnancy test was borderline.  She had an abnormally short period at the beginning of January.  No nausea or vomiting.  No significant abdominal pain.  No dysuria     Physical Exam   Triage Vital Signs: ED Triage Vitals  Enc Vitals Group     BP 02/14/21 1120 131/84     Pulse Rate 02/14/21 1120 (!) 104     Resp 02/14/21 1120 18     Temp 02/14/21 1120 98.7 F (37.1 C)     Temp Source 02/14/21 1120 Oral     SpO2 02/14/21 1120 95 %     Weight 02/14/21 1118 47.6 kg (105 lb)     Height 02/14/21 1118 1.524 m (5')     Head Circumference --      Peak Flow --      Pain Score 02/14/21 1118 0     Pain Loc --      Pain Edu? --      Excl. in GC? --     Most recent vital signs: Vitals:   02/14/21 1120 02/14/21 1420  BP: 131/84 132/78  Pulse: (!) 104 99  Resp: 18 20  Temp: 98.7 F (37.1 C)   SpO2: 95% 98%     General: Awake, no distress.  CV:  Good peripheral perfusion.  Resp:  Normal effort.  Abd:  No distention.  Normal exam Other:     ED Results / Procedures / Treatments   Labs (all labs ordered are listed, but only abnormal results are displayed) Labs Reviewed  CBC - Abnormal; Notable for the following components:      Result Value   RBC 3.86 (*)    All other components within normal limits  BASIC METABOLIC PANEL - Abnormal; Notable for the following components:   Sodium 134 (*)    Glucose, Bld 60 (*)    Calcium 8.8 (*)    All other components within normal limits  HCG, QUANTITATIVE, PREGNANCY - Abnormal; Notable for the following components:   hCG, Beta Chain, Quant, S 40,332 (*)    All  other components within normal limits  URINALYSIS, COMPLETE (UACMP) WITH MICROSCOPIC - Abnormal; Notable for the following components:   Color, Urine YELLOW (*)    APPearance HAZY (*)    Hgb urine dipstick SMALL (*)    Protein, ur 30 (*)    Bacteria, UA FEW (*)    All other components within normal limits  POC URINE PREG, ED - Abnormal; Notable for the following components:   Preg Test, Ur POSITIVE (*)    All other components within normal limits  ABO/RH     EKG     RADIOLOGY Ultrasound pelvis reviewed by me, IUP noted    PROCEDURES:  Critical Care performed:   Procedures   MEDICATIONS ORDERED IN ED: Medications - No data to display   IMPRESSION / MDM / ASSESSMENT AND PLAN / ED COURSE  I reviewed the triage vital signs and the nursing notes.  Patient's pregnancy test is positive, she is here with vaginal bleeding.  Pending hCG, will  send for ultrasound  BMP demonstrates borderline glucose and mild hyponatremia  CBC is unremarkable.  Pending ABO Rh  Patient is Rh+  Ultrasound demonstrates IUP, 6 weeks 3 days, heart rate 121  Patient is reassured, will arrange outpatient follow-up with OB, return precautions discussed          FINAL CLINICAL IMPRESSION(S) / ED DIAGNOSES   Final diagnoses:  Vaginal bleeding  Vaginal bleeding in pregnancy     Rx / DC Orders   ED Discharge Orders     None        Note:  This document was prepared using Dragon voice recognition software and may include unintentional dictation errors.   Jene Every, MD 02/14/21 1421

## 2021-02-14 NOTE — ED Triage Notes (Signed)
Pt via POV from home. Pt c/o abd cramping and vaginal bleeding since last night. Pt unknown how many weeks she is LMP was 01/03/21. G3 with 1 living child. Pt is A&OX4 and NAD.

## 2022-11-08 ENCOUNTER — Ambulatory Visit: Payer: Self-pay

## 2022-11-12 ENCOUNTER — Encounter: Payer: Self-pay | Admitting: *Deleted

## 2022-11-12 ENCOUNTER — Ambulatory Visit
Admission: EM | Admit: 2022-11-12 | Discharge: 2022-11-12 | Disposition: A | Discharge status: Home / Self Care | Payer: Medicaid Other | Attending: Emergency Medicine | Admitting: Emergency Medicine

## 2022-11-12 DIAGNOSIS — Z202 Contact with and (suspected) exposure to infections with a predominantly sexual mode of transmission: Secondary | ICD-10-CM | POA: Diagnosis not present

## 2022-11-12 DIAGNOSIS — N898 Other specified noninflammatory disorders of vagina: Secondary | ICD-10-CM | POA: Diagnosis present

## 2022-11-12 HISTORY — DX: Anxiety disorder, unspecified: F41.9

## 2022-11-12 HISTORY — DX: Depression, unspecified: F32.A

## 2022-11-12 MED ORDER — DOXYCYCLINE HYCLATE 100 MG PO CAPS: 100.0000 mg | ORAL_CAPSULE | Freq: Two times a day (BID) | ORAL | 0 refills | Status: DC

## 2022-11-12 NOTE — ED Triage Notes (Signed)
C/O vaginal discharge onset 2 days ago without abd pain. Also c/o genital irritation.

## 2022-11-12 NOTE — ED Provider Notes (Signed)
[facility]    CSN: [id number] Arrival date & time: 11/12/22  1121      History   Chief Complaint Chief Complaint  Patient presents with   Vaginal Discharge    HPI Jennifer Bray is a 22 y.o. female.   Patient presents for evaluation of Esly Selvage thick to thin vaginal discharge and intermittent vaginal itching present for 2 days.  Known exposure to chlamydia from partner.  Has not attempted treatment.  Denies vaginal odor, abdominal or flank pain, fever or urinary symptoms.  Currently breast-feeding.  Past Medical History:  Diagnosis Date   Anxiety    Depression    Seasonal allergies     There are no problems to display for this patient.   History reviewed. No pertinent surgical history.  OB History     Gravida  1   Para      Term      Preterm      AB      Living         SAB      IAB      Ectopic      Multiple      Live Births               Home Medications    Prior to Admission medications   Medication Sig Start Date End Date Taking? Authorizing Provider  doxycycline (VIBRAMYCIN) 100 MG capsule Take 1 capsule (100 mg total) by mouth 2 (two) times daily. 11/12/22  Yes Valinda Hoar, NP  FLUoxetine (PROZAC) 10 MG capsule Take by mouth. 01/30/20  Yes [provider]  ibuprofen (ADVIL) 600 MG tablet Take 1 tablet (600 mg total) by mouth every 6 (six) hours as needed. 07/18/20   Becky Augusta, NP  metroNIDAZOLE (FLAGYL) 500 MG tablet Take 1 tablet (500 mg total) by mouth 2 (two) times daily. 11/05/20   Valinda Hoar, NP  montelukast (SINGULAIR) 10 MG tablet Take by mouth. 03/29/17 03/29/18  [provider]  omeprazole (PRILOSEC) 20 MG capsule Take 1 capsule (20 mg total) by mouth daily. 10/01/20   Becky Augusta, NP  traZODone (DESYREL) 50 MG tablet Take 1 tablet (50 mg total) by mouth at bedtime for 15 days. 02/18/20 03/04/20  Shirlee Latch, PA-C  medroxyPROGESTERone (DEPO-PROVERA) 150 MG/ML injection Inject 150 mg into  the muscle every 3 (three) months.  12/11/19  [provider]    Family History History reviewed. No pertinent family history.  Social History Social History   Tobacco Use   Smoking status: Never   Smokeless tobacco: Never  Vaping Use   Vaping status: Never Used  Substance Use Topics   Alcohol use: Not Currently   Drug use: Not Currently    Types: Marijuana     Allergies   Patient has no known allergies.   Review of Systems Review of Systems   Physical Exam Triage Vital Signs ED Triage Vitals [11/12/22 1132]  Encounter Vitals Group     BP 124/80     Systolic BP Percentile      Diastolic BP Percentile      Pulse Rate 97     Resp 16     Temp 99.1 F (37.3 C)     Temp Source Oral     SpO2 98 %     Weight      Height      Head Circumference      Peak Flow      Pain  Score 0     Pain Loc      Pain Education      Exclude from Growth Chart    No data found.  Updated Vital Signs BP 124/80   Pulse 97   Temp 99.1 F (37.3 C) (Oral)   Resp 16   SpO2 98%   Breastfeeding Yes   Visual Acuity Right Eye Distance:   Left Eye Distance:   Bilateral Distance:    Right Eye Near:   Left Eye Near:    Bilateral Near:     Physical Exam Constitutional:      Appearance: Normal appearance.  Eyes:     Extraocular Movements: Extraocular movements intact.  Pulmonary:     Effort: Pulmonary effort is normal.  Genitourinary:    Comments: deferred Neurological:     Mental Status: She is alert and oriented to person, place, and time. Mental status is at baseline.      UC Treatments / Results  Labs (all labs ordered are listed, but only abnormal results are displayed) Labs Reviewed  [facility] ONLY    EKG   Radiology No results found.  Procedures Procedures (including critical care time)  Medications Ordered in UC Medications - No data to display  Initial Impression / Assessment and Plan / UC Course  I have reviewed the triage  vital signs and the nursing notes.  Pertinent labs & imaging results that were available during my care of the patient were reviewed by me and considered in my medical decision making (see chart for details).  Vaginal discharge, exposure to STD  Prophylactically  treated for chlamydia, doxycycline prescribed STI labs pending will treat per protocol, advised abstinence until lab results, and/or treatment is complete, advised condom use during all sexual encounters moving, may follow-up with urgent care as needed  Final Clinical Impressions(s) / UC Diagnoses   Final diagnoses:  Vaginal discharge  Exposure to STD     Discharge Instructions      You are being prophylactically treated for chlamydia, take doxycycline every morning and every evening for 7 days  Please ensure partner has completed all of their medicine prior to resuming sexual intercourse with them  Labs pending 2-3 days, you will be contacted if positive for any sti and treatment will be sent to the pharmacy, you will have to return to the clinic if positive for gonorrhea to receive treatment   Please refrain from having sex until labs results, if positive please refrain from having sex until treatment complete and symptoms resolve   If positive for , Chlamydia  gonorrhea or trichomoniasis please notify partner or partners so they may tested as well  Moving forward, it is recommended you use some form of protection against the transmission of sti infections  such as condoms or dental dams with each sexual encounter     ED Prescriptions     Medication Sig Dispense Auth. Provider   doxycycline (VIBRAMYCIN) 100 MG capsule Take 1 capsule (100 mg total) by mouth 2 (two) times daily. 14 capsule Montario Zilka, Elita Boone, NP      PDMP not reviewed this encounter.   Valinda Hoar, NP 11/12/22 1146

## 2022-11-12 NOTE — Discharge Instructions (Signed)
You are being prophylactically treated for chlamydia, take doxycycline every morning and every evening for 7 days  Please ensure partner has completed all of their medicine prior to resuming sexual intercourse with them  Labs pending 2-3 days, you will be contacted if positive for any sti and treatment will be sent to the pharmacy, you will have to return to the clinic if positive for gonorrhea to receive treatment   Please refrain from having sex until labs results, if positive please refrain from having sex until treatment complete and symptoms resolve   If positive for , Chlamydia  gonorrhea or trichomoniasis please notify partner or partners so they may tested as well  Moving forward, it is recommended you use some form of protection against the transmission of sti infections  such as condoms or dental dams with each sexual encounter

## 2022-11-14 ENCOUNTER — Telehealth (HOSPITAL_BASED_OUTPATIENT_CLINIC_OR_DEPARTMENT_OTHER): Payer: Self-pay

## 2022-11-14 LAB — CERVICOVAGINAL ANCILLARY ONLY
Bacterial Vaginitis (gardnerella): NEGATIVE
Candida Glabrata: NEGATIVE
Candida Vaginitis: POSITIVE — AB
Chlamydia: NEGATIVE
Comment: NEGATIVE
Comment: NEGATIVE
Comment: NEGATIVE
Comment: NEGATIVE
Comment: NEGATIVE
Comment: NORMAL
Neisseria Gonorrhea: NEGATIVE
Trichomonas: NEGATIVE

## 2022-11-14 MED ORDER — FLUCONAZOLE 150 MG PO TABS: 150.0000 mg | ORAL_TABLET | Freq: Once | ORAL | 0 refills | Status: AC

## 2022-11-14 NOTE — Telephone Encounter (Signed)
Per protocol, pt requires tx with Diflucan.  Attempted to reach patient x1. Unable to LVM.  Rx sent to pharmacy on file.

## 2023-02-03 ENCOUNTER — Ambulatory Visit: Payer: Self-pay

## 2023-02-07 ENCOUNTER — Ambulatory Visit
Admission: RE | Admit: 2023-02-07 | Discharge: 2023-02-07 | Disposition: A | Discharge status: Home / Self Care | Payer: Medicaid Other | Source: Ambulatory Visit | Attending: Emergency Medicine | Admitting: Emergency Medicine

## 2023-02-07 VITALS — BP 107/74 | HR 90 | Temp 99.2°F | Resp 16 | Ht 60.0 in | Wt 125.0 lb

## 2023-02-07 DIAGNOSIS — B9689 Other specified bacterial agents as the cause of diseases classified elsewhere: Secondary | ICD-10-CM

## 2023-02-07 DIAGNOSIS — N760 Acute vaginitis: Secondary | ICD-10-CM | POA: Diagnosis not present

## 2023-02-07 DIAGNOSIS — Z3202 Encounter for pregnancy test, result negative: Secondary | ICD-10-CM

## 2023-02-07 DIAGNOSIS — Z113 Encounter for screening for infections with a predominantly sexual mode of transmission: Secondary | ICD-10-CM | POA: Diagnosis not present

## 2023-02-07 DIAGNOSIS — B3731 Acute candidiasis of vulva and vagina: Secondary | ICD-10-CM | POA: Diagnosis present

## 2023-02-07 LAB — WET PREP, GENITAL
Sperm: NONE SEEN
Trich, Wet Prep: NONE SEEN
WBC, Wet Prep HPF POC: <10 — AB (ref <10)

## 2023-02-07 LAB — URINALYSIS, W/ REFLEX TO CULTURE (INFECTION SUSPECTED)
Bilirubin Urine: NEGATIVE
Glucose, UA: NEGATIVE mg/dL
Hgb urine dipstick: NEGATIVE
Ketones, ur: NEGATIVE mg/dL
Leukocytes,Ua: NEGATIVE
Nitrite: NEGATIVE
Protein, ur: NEGATIVE mg/dL
Specific Gravity, Urine: 1.02 (ref 1.005–1.030)
pH: 8.5 — ABNORMAL HIGH (ref 5.0–8.0)

## 2023-02-07 LAB — PREGNANCY, URINE: Preg Test, Ur: NEGATIVE

## 2023-02-07 MED ORDER — METRONIDAZOLE 500 MG PO TABS: 500.0000 mg | ORAL_TABLET | Freq: Two times a day (BID) | ORAL | 0 refills | Status: AC

## 2023-02-07 MED ORDER — FLUCONAZOLE 150 MG PO TABS: 150.0000 mg | ORAL_TABLET | Freq: Once | ORAL | 1 refills | Status: AC

## 2023-02-07 NOTE — Discharge Instructions (Signed)
 Finish the Flagyl  and Diflucan , even if you feel better.  While these medications are generally considered safe in breast-feeding, try not to breast-feed for at 2 hours after taking the Flagyl  and Diflucan .  Give us  a working phone number so that we can contact you if needed. Refrain from sexual contact until all of your labs have come back, symptoms have resolved, and your partner(s) are treated if necessary. Return to the ER if you get worse, have a fever >100.4, or for any concerns.   Go to www.goodrx.com  or www.costplusdrugs.com to look up your medications. This will give you a list of where you can find your prescriptions at the most affordable prices. Or ask the pharmacist what the cash price is, or if they have any other discount programs available to help make your medication more affordable. This can be less expensive than what you would pay with insurance.

## 2023-02-07 NOTE — ED Triage Notes (Signed)
 Pt presents to UC for STD testing. Denies any active sx's.

## 2023-02-07 NOTE — ED Provider Notes (Signed)
 HPI  SUBJECTIVE:  Jennifer Bray is a 23 y.o. female who presents with 2 weeks of dysuria, urgency, cloudy urine, and nonodorous, thin white vaginal discharge.  Some mild nausea.  No urinary frequency, odorous urine, hematuria, vaginal odor, bleeding, vaginal itching, genital rash, vomiting, fevers, abdominal, back, pelvic pain.  She is in a long-term monogamous relationship with a female who is asymptomatic.  However, STDs are a concern today.  She is requesting STI screening, UA and urine pregnancy testing.  She had chlamydia back in December and states that she finished her medications.  She has a past medical history of chlamydia, trichomonas, BV and yeast.  No history of gonorrhea, HIV, HSV, syphilis, diabetes.  LMP: She is on Depo, and is irregular.  She is also breast-feeding.  PCP: None.   Past Medical History:  Diagnosis Date   Anxiety    Depression    Seasonal allergies     History reviewed. No pertinent surgical history.  History reviewed. No pertinent family history.  Social History   Tobacco Use   Smoking status: Never   Smokeless tobacco: Never  Vaping Use   Vaping status: Never Used  Substance Use Topics   Alcohol use: Not Currently   Drug use: Not Currently    Types: Marijuana    No current facility-administered medications for this encounter.  Current Outpatient Medications:    fluconazole  (DIFLUCAN ) 150 MG tablet, Take 1 tablet (150 mg total) by mouth once for 1 dose. 1 tab po x 1. May repeat in 72 hours if no improvement, Disp: 2 tablet, Rfl: 1   metroNIDAZOLE  (FLAGYL ) 500 MG tablet, Take 1 tablet (500 mg total) by mouth 2 (two) times daily for 7 days., Disp: 14 tablet, Rfl: 0   FLUoxetine (PROZAC) 10 MG capsule, Take by mouth., Disp: , Rfl:    ibuprofen  (ADVIL ) 600 MG tablet, Take 1 tablet (600 mg total) by mouth every 6 (six) hours as needed., Disp: 30 tablet, Rfl: 0   montelukast (SINGULAIR) 10 MG tablet, Take by mouth., Disp: , Rfl:    omeprazole   (PRILOSEC) 20 MG capsule, Take 1 capsule (20 mg total) by mouth daily., Disp: 30 capsule, Rfl: 0   traZODone  (DESYREL ) 50 MG tablet, Take 1 tablet (50 mg total) by mouth at bedtime for 15 days., Disp: 15 tablet, Rfl: 0  No Known Allergies   ROS  As noted in HPI.   Physical Exam  BP 107/74 (BP Location: Left Arm)   Pulse 90   Temp 99.2 F (37.3 C) (Oral)   Resp 16   Ht 5' (1.524 m)   Wt 56.7 kg   LMP  (LMP Unknown)   SpO2 99%   Breastfeeding Yes   BMI 24.41 kg/m   Constitutional: Well developed, well nourished, no acute distress Eyes:  EOMI, conjunctiva normal bilaterally HENT: Normocephalic, atraumatic,mucus membranes moist Respiratory: Normal inspiratory effort Cardiovascular: Normal rate GI: nondistended.  No suprapubic, lower quadrant tenderness Back: No CVAT skin: No rash, skin intact Musculoskeletal: no deformities Neurologic: Alert & oriented x 3, no focal neuro deficits Psychiatric: Speech and behavior appropriate   ED Course   Medications - No data to display  Orders Placed This Encounter  Procedures   Wet prep, genital    Standing Status:   Standing    Number of Occurrences:   1   Urinalysis, w/ Reflex to Culture (Infection Suspected) -Urine, Clean Catch    Standing Status:   Standing    Number of Occurrences:  1    Specimen Source:   Urine, Clean Catch [76]   Pregnancy, urine    Standing Status:   Standing    Number of Occurrences:   1   Nursing Communication Please set up with a female PCP prior to discharge    Please set up with a female PCP prior to discharge    Standing Status:   Standing    Number of Occurrences:   1    Results for orders placed or performed during the hospital encounter of 02/07/23 (from the past 24 hours)  Urinalysis, w/ Reflex to Culture (Infection Suspected) -Urine, Clean Catch     Status: Abnormal   Collection Time: 02/07/23  2:56 PM  Result Value Ref Range   Specimen Source URINE, CLEAN CATCH    Color, Urine  YELLOW YELLOW   APPearance CLEAR CLEAR   Specific Gravity, Urine 1.020 1.005 - 1.030   pH 8.5 (H) 5.0 - 8.0   Glucose, UA NEGATIVE NEGATIVE mg/dL   Hgb urine dipstick NEGATIVE NEGATIVE   Bilirubin Urine NEGATIVE NEGATIVE   Ketones, ur NEGATIVE NEGATIVE mg/dL   Protein, ur NEGATIVE NEGATIVE mg/dL   Nitrite NEGATIVE NEGATIVE   Leukocytes,Ua NEGATIVE NEGATIVE   Squamous Epithelial / HPF 6-10 0 - 5 /HPF   WBC, UA 0-5 0 - 5 WBC/hpf   RBC / HPF 0-5 0 - 5 RBC/hpf   Bacteria, UA FEW (A) NONE SEEN   Mucus PRESENT   Wet prep, genital     Status: Abnormal   Collection Time: 02/07/23  2:56 PM   Specimen: Urine, Clean Catch  Result Value Ref Range   Yeast Wet Prep HPF POC PRESENT (A) NONE SEEN   Trich, Wet Prep NONE SEEN NONE SEEN   Clue Cells Wet Prep HPF POC PRESENT (A) NONE SEEN   WBC, Wet Prep HPF POC <10 (A) <10   Sperm NONE SEEN   Pregnancy, urine     Status: None   Collection Time: 02/07/23  2:56 PM  Result Value Ref Range   Preg Test, Ur NEGATIVE NEGATIVE   No results found.  ED Clinical Impression  1. BV (bacterial vaginosis)   2. Yeast vaginitis   3. Screening for STDs (sexually transmitted diseases)   4. Urine pregnancy test negative      ED Assessment/Plan    Urine pregnancy negative.  Positive BV, yeast.  UA with a few bacteria, but is a contaminated specimen.  No UTI.  Gonorrhea, chlamydia sent.  If it comes back positive, it could be a false positive for chlamydia as she was treated for chlamydia 2 months ago.  Discussed with her that if it comes back positive it may not be a new infection, but residual from her previous infection.  Discussed with her that repeat testing for chlamydia is 3 months after treatment.  Flagyl  for bacterial vaginosis, Diflucan  for yeast.  Will have staff set patient up with a female PCP prior to discharge.  Discussed labs, MDM, treatment plan, and plan for follow-up with patient. Discussed sn/sx that should prompt return to the ED. patient  agrees with plan.   Meds ordered this encounter  Medications   metroNIDAZOLE  (FLAGYL ) 500 MG tablet    Sig: Take 1 tablet (500 mg total) by mouth 2 (two) times daily for 7 days.    Dispense:  14 tablet    Refill:  0   fluconazole  (DIFLUCAN ) 150 MG tablet    Sig: Take 1 tablet (150 mg total) by  mouth once for 1 dose. 1 tab po x 1. May repeat in 72 hours if no improvement    Dispense:  2 tablet    Refill:  1      *This clinic note was created using Scientist, clinical (histocompatibility and immunogenetics). Therefore, there may be occasional mistakes despite careful proofreading.  ?    Van Knee, MD 02/08/23 1245

## 2023-02-08 LAB — CERVICOVAGINAL ANCILLARY ONLY
Chlamydia: NEGATIVE
Comment: NEGATIVE
Comment: NORMAL
Neisseria Gonorrhea: NEGATIVE

## 2023-02-09 ENCOUNTER — Ambulatory Visit: Payer: Self-pay

## 2023-03-20 ENCOUNTER — Ambulatory Visit: Payer: Medicaid Other | Admitting: Physician Assistant

## 2023-05-23 ENCOUNTER — Encounter: Payer: Self-pay | Admitting: *Deleted

## 2023-05-23 ENCOUNTER — Ambulatory Visit: Admitting: Family Medicine

## 2023-08-15 ENCOUNTER — Ambulatory Visit
Admission: RE | Admit: 2023-08-15 | Discharge: 2023-08-15 | Disposition: A | Discharge status: Home / Self Care | Source: Ambulatory Visit

## 2023-08-15 VITALS — BP 91/62 | HR 96 | Temp 98.3°F | Resp 16 | Wt 115.0 lb

## 2023-08-15 DIAGNOSIS — N3001 Acute cystitis with hematuria: Secondary | ICD-10-CM | POA: Insufficient documentation

## 2023-08-15 LAB — URINALYSIS, ROUTINE W REFLEX MICROSCOPIC
Glucose, UA: NEGATIVE mg/dL
Leukocytes,Ua: NEGATIVE
Nitrite: NEGATIVE
Protein, ur: 30 mg/dL — AB
Specific Gravity, Urine: 1.025 (ref 1.005–1.030)
pH: 7 (ref 5.0–8.0)

## 2023-08-15 LAB — URINALYSIS, MICROSCOPIC (REFLEX)

## 2023-08-15 LAB — PREGNANCY, URINE: Preg Test, Ur: NEGATIVE

## 2023-08-15 MED ORDER — NITROFURANTOIN MONOHYD MACRO 100 MG PO CAPS: 100.0000 mg | ORAL_CAPSULE | Freq: Two times a day (BID) | ORAL | 0 refills | Status: DC

## 2023-08-15 MED ORDER — PHENAZOPYRIDINE HCL 200 MG PO TABS: 200.0000 mg | ORAL_TABLET | Freq: Three times a day (TID) | ORAL | 0 refills | Status: DC

## 2023-08-15 NOTE — Discharge Instructions (Addendum)
  1. Acute cystitis with hematuria (Primary) - Pregnancy, urine complete and UC is negative - Urinalysis, Routine w reflex microscopic -Urine, Clean Catch completed in UC shows no nitrite, no leukocytes, trace blood. - Cervicovaginal swab collected in UC, results should be available in 2 to 3 days via MyChart if any abnormal findings patient will be contacted and appropriate treatment provided. - Urinalysis, Microscopic (reflex) completed in UC shows few bacteria which is possibly consistent with urinary tract infection - Urine Culture collected in UC and sent to lab for further testing, results should be available in 2 to 3 days via MyChart - nitrofurantoin , macrocrystal-monohydrate, (MACROBID ) 100 MG capsule; Take 1 capsule (100 mg total) by mouth 2 (two) times daily.  Dispense: 10 capsule; Refill: 0 - phenazopyridine  (PYRIDIUM ) 200 MG tablet; Take 1 tablet (200 mg total) by mouth 3 (three) times daily.  Dispense: 6 tablet; Refill: 0 - Continue to monitor symptoms for any change in severity if there is any escalation of your current symptoms or development of new symptoms follow-up for further evaluation management.

## 2023-08-15 NOTE — ED Triage Notes (Signed)
 Pt c/o dysuria and urinary frequency x 1 week. Pt states she was treated for BV and chlamydia at a clinic in [place] about 1 week ago. After taking the medication her symptoms have not gone away. She denies any vaginal itching or discharge. She wants to be tested or STI, BV and wants a pregnancy test.

## 2023-08-15 NOTE — ED Provider Notes (Signed)
 [facility]  Note:  This document was prepared using Conservation officer, historic buildings and may include unintentional dictation errors.  MRN: [id number] DOB: 2001/01/04  Subjective:   Jennifer Bray is a 23 y.o. female presenting for dysuria, urinary frequency x 1 week.  Patient reports that she was recently treated for BV and chlamydia infections with metronidazole  and doxycycline .  Patient reports of finishing medication and symptoms of vaginal discharge and vaginal odor improved.  The patient is having some mild vaginal itching at this time but no vaginal lesions, or vaginal discharge.  Patient requesting repeat STI testing, testing for BV and pregnancy test today in urgent care.  No shortness of breath, chest pain, weakness, dizziness.  No current facility-administered medications for this encounter.  Current Outpatient Medications:    nitrofurantoin , macrocrystal-monohydrate, (MACROBID ) 100 MG capsule, Take 1 capsule (100 mg total) by mouth 2 (two) times daily., Disp: 10 capsule, Rfl: 0   phenazopyridine  (PYRIDIUM ) 200 MG tablet, Take 1 tablet (200 mg total) by mouth 3 (three) times daily., Disp: 6 tablet, Rfl: 0   FLUoxetine (PROZAC) 10 MG capsule, Take by mouth., Disp: , Rfl:    ibuprofen  (ADVIL ) 600 MG tablet, Take 1 tablet (600 mg total) by mouth every 6 (six) hours as needed., Disp: 30 tablet, Rfl: 0   montelukast (SINGULAIR) 10 MG tablet, Take by mouth., Disp: , Rfl:    omeprazole  (PRILOSEC) 20 MG capsule, Take 1 capsule (20 mg total) by mouth daily., Disp: 30 capsule, Rfl: 0   traZODone  (DESYREL ) 50 MG tablet, Take 1 tablet (50 mg total) by mouth at bedtime for 15 days., Disp: 15 tablet, Rfl: 0   No Known Allergies  Past Medical History:  Diagnosis Date   Anxiety    Depression    Seasonal allergies      History reviewed. No pertinent surgical history.  No family history on file.  Social History   Tobacco Use   Smoking status: Never   Smokeless tobacco:  Never  Vaping Use   Vaping status: Never Used  Substance Use Topics   Alcohol use: Not Currently   Drug use: Not Currently    Types: Marijuana    ROS Refer to HPI for ROS details.  Objective:   Vitals: BP 91/62 (BP Location: Right Arm)   Pulse 96   Temp 98.3 F (36.8 C) (Oral)   Resp 16   Wt 115 lb (52.2 kg)   LMP 08/09/2023   SpO2 99%   BMI 22.46 kg/m   Physical Exam Vitals and nursing note reviewed.  Constitutional:      General: She is not in acute distress.    Appearance: Normal appearance. She is well-developed. She is not ill-appearing or toxic-appearing.  HENT:     Head: Normocephalic and atraumatic.  Cardiovascular:     Rate and Rhythm: Normal rate.  Pulmonary:     Effort: Pulmonary effort is normal. No respiratory distress.  Abdominal:     General: There is no distension.     Palpations: Abdomen is soft.     Tenderness: There is no abdominal tenderness. There is no right CVA tenderness or left CVA tenderness.  Genitourinary:    Vagina: No vaginal discharge.  Skin:    General: Skin is warm and dry.  Neurological:     General: No focal deficit present.     Mental Status: She is alert and oriented to person, place, and time.  Psychiatric:        Mood and Affect:  Mood normal.        Behavior: Behavior normal.     Procedures  Results for orders placed or performed during the hospital encounter of 08/15/23 (from the past 24 hours)  Pregnancy, urine     Status: None   Collection Time: 08/15/23 11:15 AM  Result Value Ref Range   Preg Test, Ur NEGATIVE NEGATIVE  Urinalysis, Routine w reflex microscopic -Urine, Clean Catch     Status: Abnormal   Collection Time: 08/15/23 11:15 AM  Result Value Ref Range   Color, Urine YELLOW YELLOW   APPearance HAZY (A) CLEAR   Specific Gravity, Urine 1.025 1.005 - 1.030   pH 7.0 5.0 - 8.0   Glucose, UA NEGATIVE NEGATIVE mg/dL   Hgb urine dipstick TRACE (A) NEGATIVE   Bilirubin Urine SMALL (A) NEGATIVE   Ketones,  ur TRACE (A) NEGATIVE mg/dL   Protein, ur 30 (A) NEGATIVE mg/dL   Nitrite NEGATIVE NEGATIVE   Leukocytes,Ua NEGATIVE NEGATIVE  Urinalysis, Microscopic (reflex)     Status: Abnormal   Collection Time: 08/15/23 11:15 AM  Result Value Ref Range   RBC / HPF 0-5 0 - 5 RBC/hpf   WBC, UA 6-10 0 - 5 WBC/hpf   Bacteria, UA FEW (A) NONE SEEN   Squamous Epithelial / HPF 6-10 0 - 5 /HPF    Assessment and Plan :     Discharge Instructions       1. Acute cystitis with hematuria (Primary) - Pregnancy, urine complete and UC is negative - Urinalysis, Routine w reflex microscopic -Urine, Clean Catch completed in UC shows no nitrite, no leukocytes, trace blood. - Cervicovaginal swab collected in UC, results should be available in 2 to 3 days via MyChart if any abnormal findings patient will be contacted and appropriate treatment provided. - Urinalysis, Microscopic (reflex) completed in UC shows few bacteria which is possibly consistent with urinary tract infection - Urine Culture collected in UC and sent to lab for further testing, results should be available in 2 to 3 days via MyChart - nitrofurantoin , macrocrystal-monohydrate, (MACROBID ) 100 MG capsule; Take 1 capsule (100 mg total) by mouth 2 (two) times daily.  Dispense: 10 capsule; Refill: 0 - phenazopyridine  (PYRIDIUM ) 200 MG tablet; Take 1 tablet (200 mg total) by mouth 3 (three) times daily.  Dispense: 6 tablet; Refill: 0 - Continue to monitor symptoms for any change in severity if there is any escalation of your current symptoms or development of new symptoms follow-up for further evaluation management.      Flannery Cavallero B Coye Dawood   Azariah Bonura, [place], [place] 08/15/23 [phone]

## 2023-08-16 ENCOUNTER — Ambulatory Visit (HOSPITAL_COMMUNITY): Payer: Self-pay

## 2023-08-16 LAB — URINE CULTURE
Culture: NO GROWTH
Special Requests: NORMAL

## 2023-08-16 LAB — CERVICOVAGINAL ANCILLARY ONLY
Bacterial Vaginitis (gardnerella): POSITIVE — AB
Candida Glabrata: NEGATIVE
Candida Vaginitis: POSITIVE — AB
Chlamydia: NEGATIVE
Comment: NEGATIVE
Comment: NEGATIVE
Comment: NEGATIVE
Comment: NEGATIVE
Comment: NEGATIVE
Comment: NORMAL
Neisseria Gonorrhea: NEGATIVE
Trichomonas: NEGATIVE

## 2023-08-16 MED ORDER — FLUCONAZOLE 150 MG PO TABS: 150.0000 mg | ORAL_TABLET | Freq: Once | ORAL | 0 refills | Status: AC

## 2023-09-13 IMAGING — US US OB < 14 WEEKS - US OB TV
1 series · 15 of 28 positions shown · non-contrast
Comparison: None.

CLINICAL DATA: Vaginal bleeding.

EXAM:
OBSTETRIC <14 WK US AND TRANSVAGINAL OB US
TECHNIQUE: Both transabdominal and transvaginal ultrasound examinations were
performed for complete evaluation of the gestation as well as the
maternal uterus, adnexal regions, and pelvic cul-de-sac.
Transvaginal technique was performed to assess early pregnancy.

[Series 1: us ob comp less 14 wks · 15 of 128 slices shown]
[im 1/128]
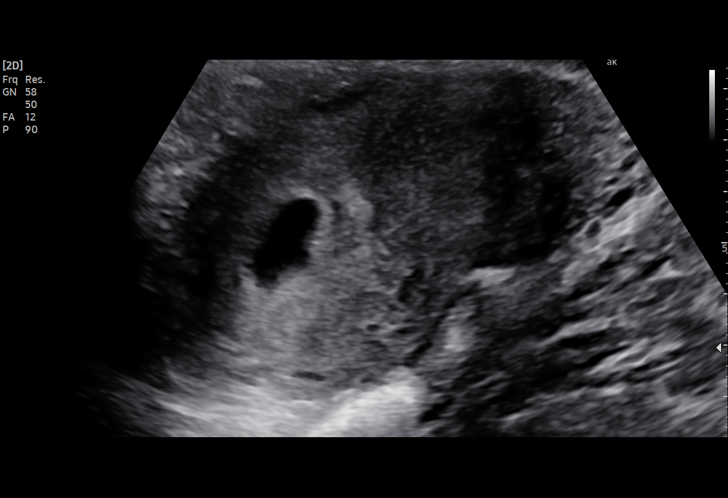
[im 10/128]
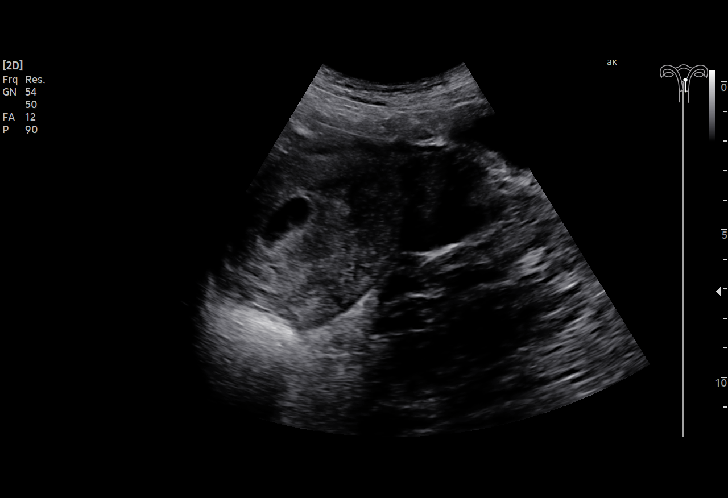
[im 19/128]
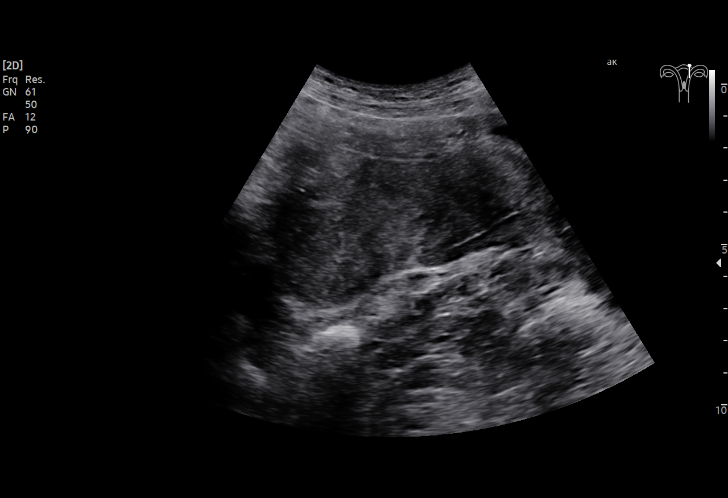
[im 29/128]
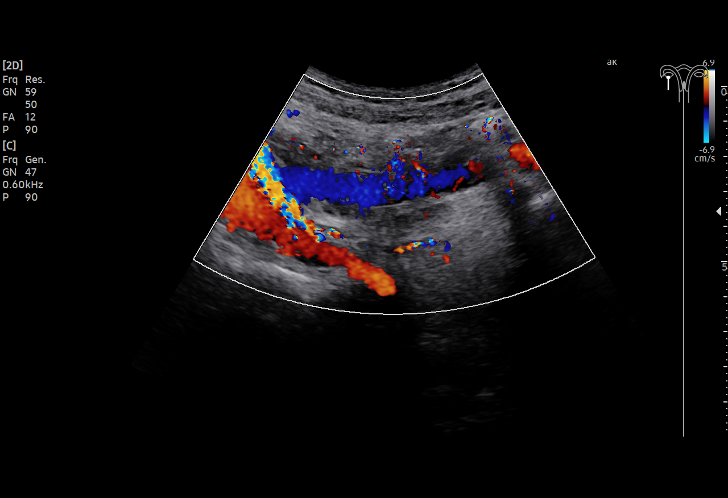
[im 38/128]
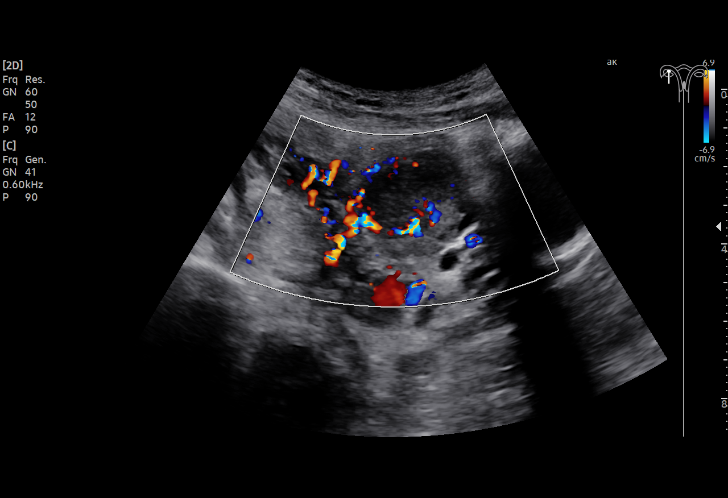
[im 48/128]
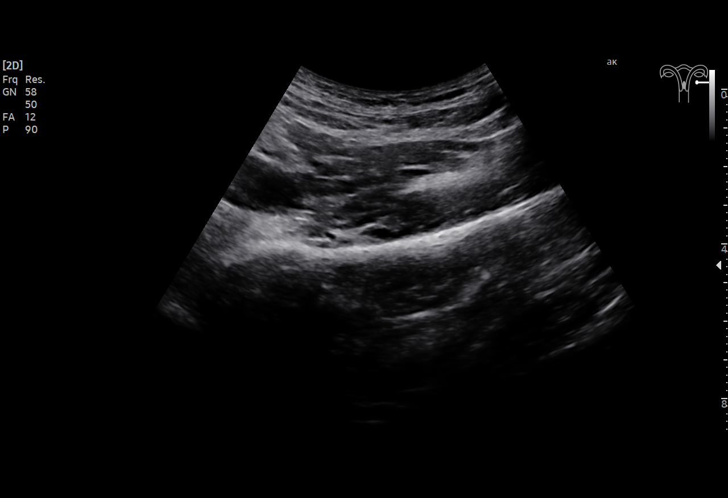
[im 57/128]
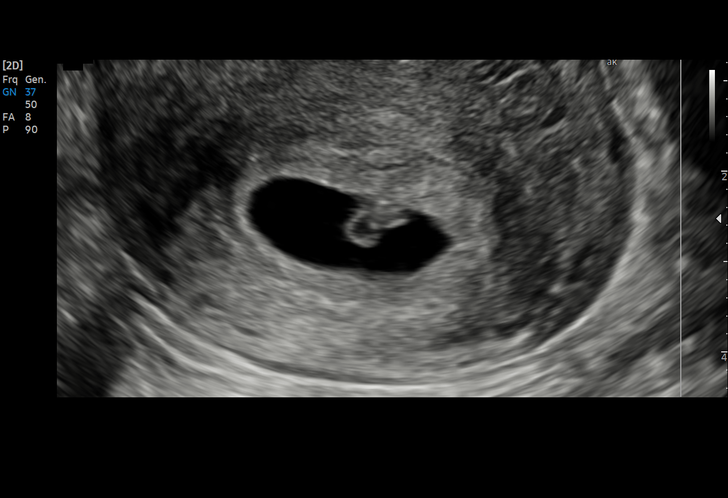
[im 66/128]
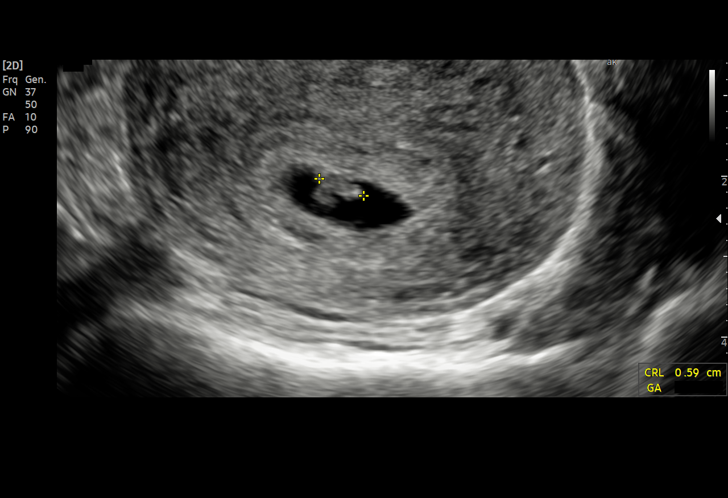
[im 71/128]
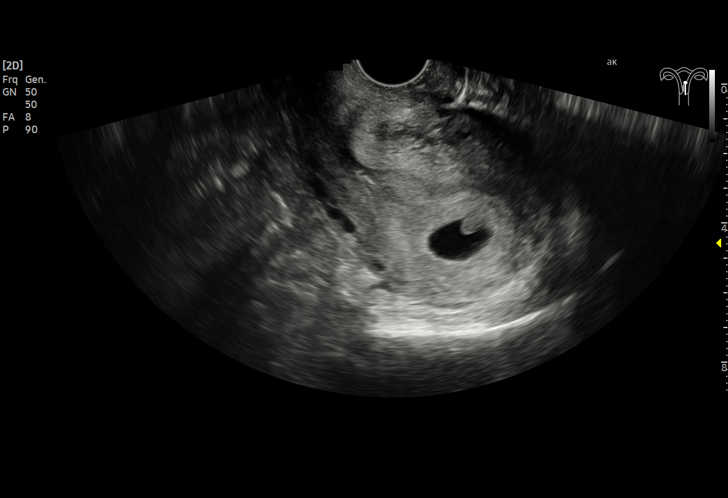
[im 80/128]
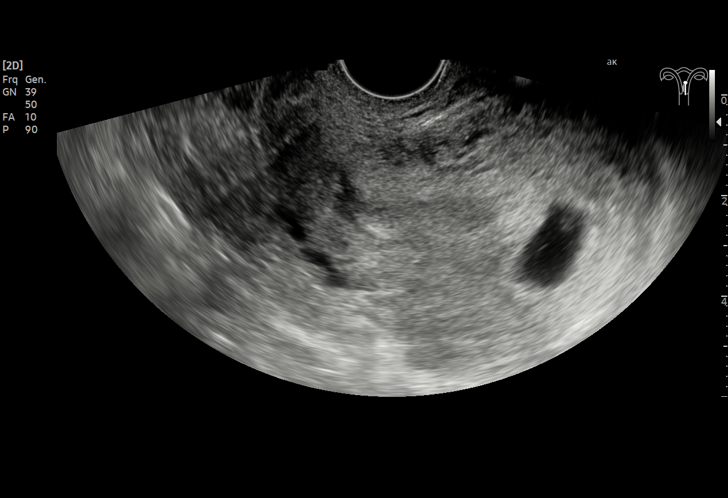
[im 90/128]
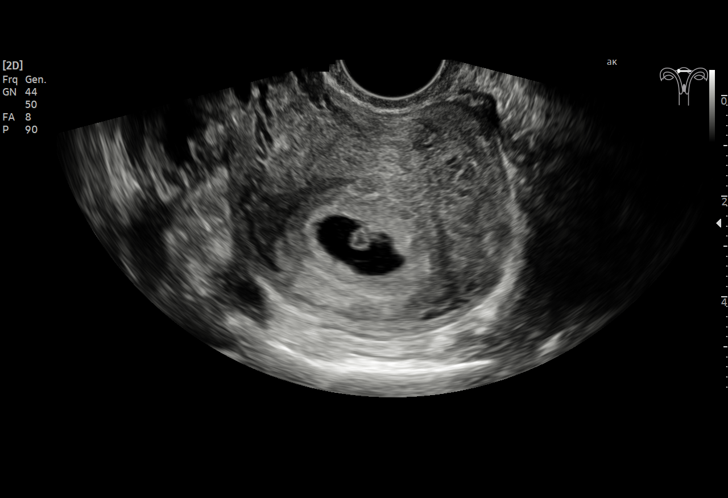
[im 99/128]
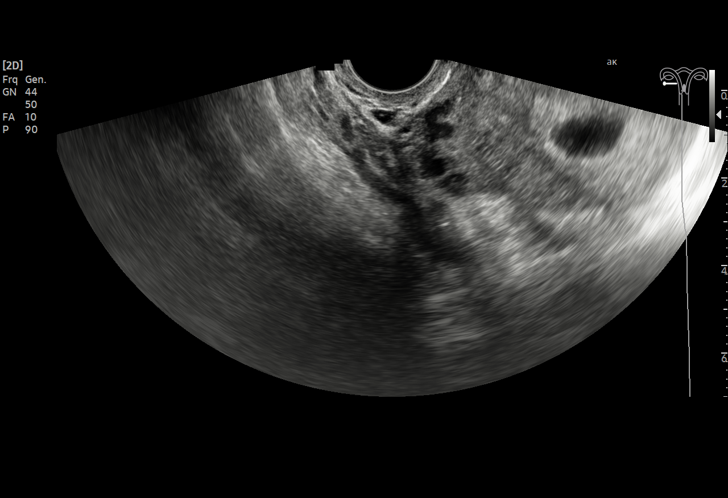
[im 109/128]
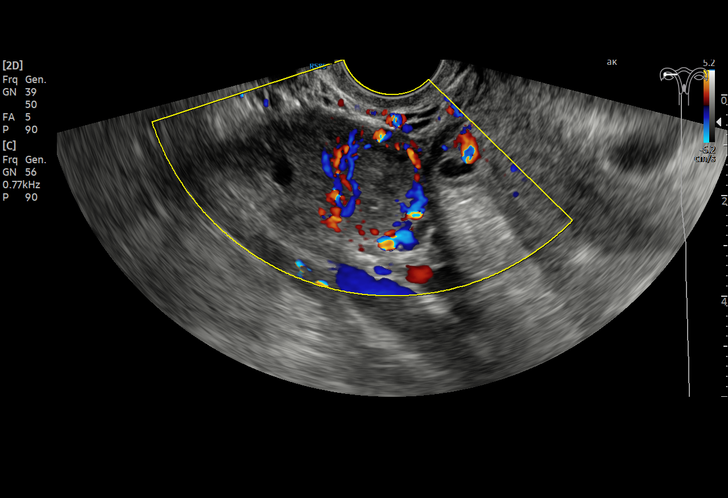
[im 118/128]
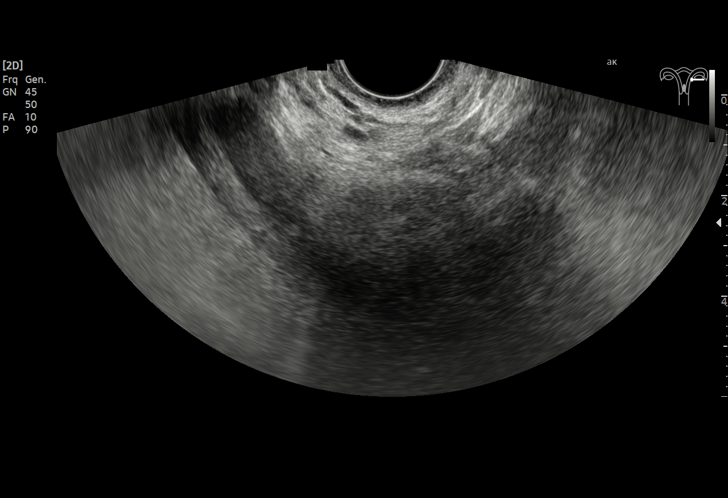
[im 128/128]
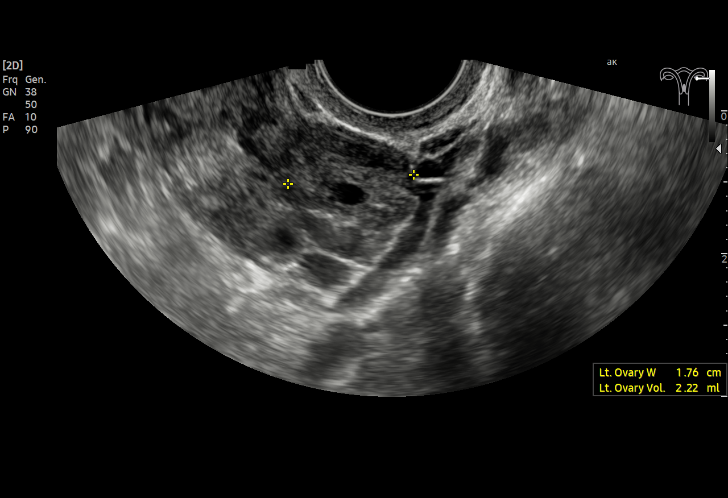

[15 of 28 positions shown; findings below may reference images not displayed]

FINDINGS: Intrauterine gestational sac: Single

Yolk sac:  Visualized.

Embryo:  Visualized.

Cardiac Activity: Visualized.

Heart Rate: 121 bpm

CRL:  5.9 mm   6 w   3 d                  US EDC: 10/07/2021

Subchorionic hemorrhage:  None visualized.

Maternal uterus/adnexae: Unremarkable.
IMPRESSION: Single viable IUP as above.

## 2023-09-22 ENCOUNTER — Encounter: Payer: Self-pay | Admitting: Emergency Medicine

## 2023-09-22 ENCOUNTER — Ambulatory Visit
Admission: EM | Admit: 2023-09-22 | Discharge: 2023-09-22 | Disposition: A | Discharge status: Home / Self Care | Attending: Family Medicine | Admitting: Family Medicine

## 2023-09-22 DIAGNOSIS — Z711 Person with feared health complaint in whom no diagnosis is made: Secondary | ICD-10-CM | POA: Insufficient documentation

## 2023-09-22 DIAGNOSIS — N898 Other specified noninflammatory disorders of vagina: Secondary | ICD-10-CM | POA: Diagnosis present

## 2023-09-22 NOTE — ED Triage Notes (Signed)
 Patient reports vaginal discharge that started 2 days ago.  Patient states that she has a new partner.  Patient wants STD testing.

## 2023-09-22 NOTE — Discharge Instructions (Signed)
 Your vaginal swab test results will be available in the next 72 hours. If positive, someone will contact you.  You should see your results in your MyChart account.

## 2023-09-22 NOTE — ED Provider Notes (Signed)
 [facility]    CSN: [id number] Arrival date & time: 09/22/23  9075      History   Chief Complaint Chief Complaint  Patient presents with   Vaginal Discharge     HPI HPI Jennifer Bray is a 23 y.o. female.    Jennifer Bray presents for runny white discharge with vaginal odor that started 2 days ago.  She had sex with a new partner.  Has pain with intercourse.  Tried boric acid suppositories  prior to arrival.  Has  not had any antibiotics in last 30 days.   Denies known STI exposure.  Jennifer Bray does not use condoms regularly. She is  not currently pregnant. Says her last period was 2 weeks ago.     - Abnormal vaginal discharge: yes  - vaginal odor:  yes - vaginal bleeding: no - Dysuria: no - Hematuria: no - Urinary urgency:no  - Urinary frequency: no  - Fever: no - Abdominal pain: no  - Pelvic pain: no - Rash/Skin lesions/mouth ulcers: no - Nausea: no  - Vomiting: no  - Back Pain: no        Past Medical History:  Diagnosis Date   Anxiety    Depression    Seasonal allergies     There are no active problems to display for this patient.   History reviewed. No pertinent surgical history.  OB History     Gravida  1   Para      Term      Preterm      AB      Living         SAB      IAB      Ectopic      Multiple      Live Births               Home Medications    Prior to Admission medications   Medication Sig Start Date End Date Taking? Authorizing Provider  FLUoxetine (PROZAC) 10 MG capsule Take by mouth. 01/30/20   [provider]  ibuprofen  (ADVIL ) 600 MG tablet Take 1 tablet (600 mg total) by mouth every 6 (six) hours as needed. 07/18/20   Bernardino Ditch, NP  montelukast (SINGULAIR) 10 MG tablet Take by mouth. 03/29/17 02/07/23  [provider]  omeprazole  (PRILOSEC) 20 MG capsule Take 1 capsule (20 mg total) by mouth daily. 10/01/20   Bernardino Ditch, NP  traZODone  (DESYREL ) 50 MG tablet Take 1  tablet (50 mg total) by mouth at bedtime for 15 days. 02/18/20 03/04/20  Arvis Jolan NOVAK, PA-C  medroxyPROGESTERone (DEPO-PROVERA) 150 MG/ML injection Inject 150 mg into the muscle every 3 (three) months.  12/11/19  [provider]    Family History History reviewed. No pertinent family history.  Social History Social History   Tobacco Use   Smoking status: Never   Smokeless tobacco: Never  Vaping Use   Vaping status: Never Used  Substance Use Topics   Alcohol use: Not Currently   Drug use: Not Currently    Types: Marijuana     Allergies   Patient has no known allergies.   Review of Systems Review of Systems: :negative unless otherwise stated in HPI.      Physical Exam Triage Vital Signs ED Triage Vitals  Encounter Vitals Group     BP      Girls Systolic BP Percentile      Girls Diastolic BP Percentile      Boys Systolic  BP Percentile      Boys Diastolic BP Percentile      Pulse      Resp      Temp      Temp src      SpO2      Weight      Height      Head Circumference      Peak Flow      Pain Score      Pain Loc      Pain Education      Exclude from Growth Chart    No data found.  Updated Vital Signs BP 107/76 (BP Location: Left Arm)   Pulse 79   Temp 98.5 F (36.9 C) (Oral)   Resp 14   Ht 5' (1.524 m)   Wt 52.2 kg   LMP 09/08/2023 (Approximate)   SpO2 100%   Breastfeeding No   BMI 22.48 kg/m   Visual Acuity Right Eye Distance:   Left Eye Distance:   Bilateral Distance:    Right Eye Near:   Left Eye Near:    Bilateral Near:     Physical Exam GEN: well appearing female in no acute distress  CVS: well perfused  RESP: speaking in full sentences without pause  ABD: soft, non-tender, non-distended, no palpable masses, no CVA tenderness bilaterally   GU: deferred, patient performed self swab  SKIN: warm, dry    UC Treatments / Results  Labs (all labs ordered are listed, but only abnormal results are displayed) Labs Reviewed   CERVICOVAGINAL ANCILLARY ONLY    EKG   Radiology No results found.  Procedures Procedures (including critical care time)  Medications Ordered in UC Medications - No data to display  Initial Impression / Assessment and Plan / UC Course  I have reviewed the triage vital signs and the nursing notes.  Pertinent labs & imaging results that were available during my care of the patient were reviewed by me and considered in my medical decision making (see chart for details).      Patient is a 23 y.o.Jennifer Bray female  who presents for 2 days of vaginal discharge.  Overall patient is well-appearing and afebrile.  Vital signs stable.  Vaginal swab for yeast vaginitis, bacterial vaginitis, trichomonas, gonorrhea and chlamydia obtained.  Patient does not request prophylactic STI treatment.  Return precautions including abdominal pain, fever, chills, nausea, or vomiting given. Discussed MDM, treatment plan and plan for follow-up with patient who agrees with plan.       Final Clinical Impressions(s) / UC Diagnoses   Final diagnoses:  Vaginal discharge  Concern about STD in female without diagnosis     Discharge Instructions       Your vaginal swab test results will be available in the next 72 hours. If positive, someone will contact you.  You should see your results in your MyChart account.        ED Prescriptions   None    PDMP not reviewed this encounter.   Nilsa Macht, DO 09/22/23 [phone]

## 2023-09-25 ENCOUNTER — Ambulatory Visit (HOSPITAL_COMMUNITY): Payer: Self-pay

## 2023-09-25 LAB — CERVICOVAGINAL ANCILLARY ONLY
Bacterial Vaginitis (gardnerella): POSITIVE — AB
Candida Glabrata: NEGATIVE
Candida Vaginitis: NEGATIVE
Chlamydia: POSITIVE — AB
Comment: NEGATIVE
Comment: NEGATIVE
Comment: NEGATIVE
Comment: NEGATIVE
Comment: NEGATIVE
Comment: NORMAL
Neisseria Gonorrhea: NEGATIVE
Trichomonas: NEGATIVE

## 2023-09-25 MED ORDER — METRONIDAZOLE 500 MG PO TABS: 500.0000 mg | ORAL_TABLET | Freq: Two times a day (BID) | ORAL | 0 refills | Status: DC

## 2023-09-25 MED ORDER — DOXYCYCLINE HYCLATE 100 MG PO TABS: 100.0000 mg | ORAL_TABLET | Freq: Two times a day (BID) | ORAL | 0 refills | Status: AC

## 2023-09-26 MED ORDER — METRONIDAZOLE 0.75 % VA GEL
1.0000 | Freq: Every day | VAGINAL | 0 refills | Status: AC
Start: 2023-09-26 — End: 2023-10-01

## 2023-10-03 ENCOUNTER — Encounter (HOSPITAL_COMMUNITY): Payer: Self-pay

## 2023-12-18 ENCOUNTER — Encounter: Payer: Self-pay | Admitting: Physician Assistant

## 2023-12-18 ENCOUNTER — Ambulatory Visit: Admitting: Physician Assistant

## 2023-12-18 ENCOUNTER — Other Ambulatory Visit (HOSPITAL_COMMUNITY)
Admission: RE | Admit: 2023-12-18 | Discharge: 2023-12-18 | Disposition: A | Discharge status: Home / Self Care | Source: Ambulatory Visit | Attending: Physician Assistant | Admitting: Physician Assistant

## 2023-12-18 VITALS — BP 94/68 | HR 86 | Temp 98.1°F | Ht 60.0 in | Wt 111.0 lb

## 2023-12-18 DIAGNOSIS — Z309 Encounter for contraceptive management, unspecified: Secondary | ICD-10-CM

## 2023-12-18 DIAGNOSIS — Z0000 Encounter for general adult medical examination without abnormal findings: Secondary | ICD-10-CM | POA: Diagnosis not present

## 2023-12-18 DIAGNOSIS — M533 Sacrococcygeal disorders, not elsewhere classified: Secondary | ICD-10-CM | POA: Diagnosis not present

## 2023-12-18 DIAGNOSIS — F419 Anxiety disorder, unspecified: Secondary | ICD-10-CM | POA: Diagnosis not present

## 2023-12-18 DIAGNOSIS — Z113 Encounter for screening for infections with a predominantly sexual mode of transmission: Secondary | ICD-10-CM

## 2023-12-18 DIAGNOSIS — H9193 Unspecified hearing loss, bilateral: Secondary | ICD-10-CM

## 2023-12-18 DIAGNOSIS — J302 Other seasonal allergic rhinitis: Secondary | ICD-10-CM | POA: Insufficient documentation

## 2023-12-18 DIAGNOSIS — Z3042 Encounter for surveillance of injectable contraceptive: Secondary | ICD-10-CM

## 2023-12-18 LAB — POCT PREGNANCY, URINE

## 2023-12-18 MED ORDER — MEDROXYPROGESTERONE ACETATE 150 MG/ML IM SUSP: 150.0000 mg | INTRAMUSCULAR | 3 refills | Status: AC
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
## 2023-12-19 ENCOUNTER — Ambulatory Visit: Payer: Self-pay | Admitting: Physician Assistant

## 2023-12-19 LAB — HEPATITIS B SURFACE ANTIBODY,QUALITATIVE: Hep B Surface Ab, Qual: NONREACTIVE

## 2023-12-19 LAB — HEPATITIS B CORE ANTIBODY, TOTAL: Hep B Core Total Ab: NEGATIVE

## 2023-12-19 LAB — HEPATITIS C ANTIBODY: Hep C Virus Ab: NONREACTIVE

## 2023-12-19 LAB — SYPHILIS: RPR W/REFLEX TO RPR TITER AND TREPONEMAL ANTIBODIES, TRADITIONAL SCREENING AND DIAGNOSIS ALGORITHM: RPR Ser Ql: NONREACTIVE

## 2023-12-19 LAB — HIV ANTIBODY (ROUTINE TESTING W REFLEX): HIV Screen 4th Generation wRfx: NONREACTIVE

## 2023-12-19 LAB — HEPATITIS B SURFACE ANTIGEN: Hepatitis B Surface Ag: NEGATIVE

## 2023-12-20 LAB — CERVICOVAGINAL ANCILLARY ONLY
Bacterial Vaginitis (gardnerella): POSITIVE — AB
Candida Glabrata: NEGATIVE
Candida Vaginitis: NEGATIVE
Chlamydia: NEGATIVE
Comment: NEGATIVE
Comment: NEGATIVE
Comment: NEGATIVE
Comment: NEGATIVE
Comment: NEGATIVE
Comment: NORMAL
Neisseria Gonorrhea: NEGATIVE
Trichomonas: NEGATIVE

## 2023-12-20 MED ORDER — METRONIDAZOLE 500 MG PO TABS: 500.0000 mg | ORAL_TABLET | Freq: Two times a day (BID) | ORAL | 0 refills | Status: AC

## 2024-03-11 ENCOUNTER — Ambulatory Visit: Admitting: Physician Assistant

## 2024-03-18 ENCOUNTER — Ambulatory Visit: Admitting: Physician Assistant

## 2024-12-18 ENCOUNTER — Encounter: Admitting: Physician Assistant
